# Patient Record
Sex: Female | Born: 2000
Health system: Southern US, Community
[De-identification: ages and names within clinical notes are randomized; demographics above are authoritative.]

## PROBLEM LIST (undated history)

## (undated) DIAGNOSIS — Z23 Encounter for immunization: Secondary | ICD-10-CM

## (undated) DIAGNOSIS — G47419 Narcolepsy without cataplexy: Secondary | ICD-10-CM

## (undated) DIAGNOSIS — B2790 Infectious mononucleosis, unspecified without complication: Secondary | ICD-10-CM

## (undated) HISTORY — PX: WISDOM TOOTH EXTRACTION: SHX21

## (undated) HISTORY — PX: NASAL SEPTUM SURGERY: SHX37

## (undated) HISTORY — DX: Infectious mononucleosis, unspecified without complication: B27.90

## (undated) HISTORY — DX: Narcolepsy without cataplexy: G47.419

## (undated) HISTORY — DX: Encounter for immunization: Z23

---
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
## 2020-01-08 NOTE — Progress Notes (Signed)
[place], [place]   Chief Complaint  Patient presents with  . Follow-up    medication f/u. nauseas with OCP's    HPI:      Marilyn Lee is a 19 y.o. [id number] whose LMP was Patient's last menstrual period was 12/12/2019., presents today for nausea with OCPs for the past 1-2 months. Started OCPs 2/21 and did fine initially. Takes at 9 PM without food, but has eaten dinner a few hrs earlier. Menses are monthly, lasting 3-5 days, no BTB, minimal dysmen. Doing ok with them otherwise.  She is sex active with occas bleeding due to tear/cut with shaving at post fourchette about 3-4 wks ago. Sex reopens area and bleeds..   Past Medical History:  Diagnosis Date  . Mononucleosis   . Vaccine for human papilloma virus (HPV) types 6, 11, 16, and 18 administered     Past Surgical History:  Procedure Laterality Date  . NASAL SEPTUM SURGERY    . WISDOM TOOTH EXTRACTION    . WISDOM TOOTH EXTRACTION      Family History  Problem Relation Age of Onset  . Hypertension Father   . Lung cancer Maternal Grandmother   . Hypertension Maternal Grandmother   . Hypertension Mother   . Asthma Sister   . Allergies Sister   . Heart disease Paternal Grandmother   . Pneumonia Paternal Grandmother   . Breast cancer Neg Hx   . Ovarian cancer Neg Hx     Social History   Socioeconomic History  . Marital status: Single    Spouse name: Not on file  . Number of children: Not on file  . Years of education: Not on file  . Highest education level: Not on file  Occupational History  . Not on file  Tobacco Use  . Smoking status: Never Smoker  . Smokeless tobacco: Never Used  Vaping Use  . Vaping Use: Never used  Substance and Sexual Activity  . Alcohol use: No  . Drug use: No  . Sexual activity: Yes    Birth control/protection: Condom  Other Topics Concern  . Not on file  Social History Narrative  . Not on file   Social Determinants of Health   Financial Resource Strain:   .  Difficulty of Paying Living Expenses: Not on file  Food Insecurity:   . Worried About Charity fundraiser in the Last Year: Not on file  . Ran Out of Food in the Last Year: Not on file  Transportation Needs:   . Lack of Transportation (Medical): Not on file  . Lack of Transportation (Non-Medical): Not on file  Physical Activity:   . Days of Exercise per Week: Not on file  . Minutes of Exercise per Session: Not on file  Stress:   . Feeling of Stress : Not on file  Social Connections:   . Frequency of Communication with Friends and Family: Not on file  . Frequency of Social Gatherings with Friends and Family: Not on file  . Attends Religious Services: Not on file  . Active Member of Clubs or Organizations: Not on file  . Attends Archivist Meetings: Not on file  . Marital Status: Not on file  Intimate Partner Violence:   . Fear of Current or Ex-Partner: Not on file  . Emotionally Abused: Not on file  . Physically Abused: Not on file  . Sexually Abused: Not on file    Outpatient Medications Prior to Visit  Medication  Sig Dispense Refill  . clindamycin (CLEOCIN) 300 MG capsule Take 300 mg by mouth 3 (three) times daily.    . Norethindrone Acetate-Ethinyl Estrad-FE (MICROGESTIN 24 FE) 1-20 MG-MCG(24) tablet Take 1 tablet by mouth daily. 84 tablet 3  . predniSONE (STERAPRED UNI-PAK 48 TAB) 10 MG (48) TBPK tablet Take by mouth as directed. (Patient not taking: Reported on 01/09/2020)     No facility-administered medications prior to visit.      ROS:  Review of Systems  Constitutional: Negative for fever.  Gastrointestinal: Positive for nausea. Negative for blood in stool, constipation, diarrhea and vomiting.  Genitourinary: Positive for vaginal bleeding. Negative for dyspareunia, dysuria, flank pain, frequency, hematuria, urgency, vaginal discharge and vaginal pain.  Musculoskeletal: Negative for back pain.  Skin: Negative for rash.   BREAST: No  symptoms   OBJECTIVE:   Vitals:  BP 118/74   Ht 5\' 5"  (1.651 m)   Wt 136 lb (61.7 kg)   LMP 12/12/2019   BMI 22.63 kg/m   Physical Exam Vitals reviewed.  Constitutional:      Appearance: She is well-developed.  Pulmonary:     Effort: Pulmonary effort is normal.  Genitourinary:    General: Normal vulva.    Musculoskeletal:        General: Normal range of motion.     Cervical back: Normal range of motion.  Skin:    General: Skin is warm and dry.  Neurological:     General: No focal deficit present.     Mental Status: She is alert and oriented to person, place, and time.     Cranial Nerves: No cranial nerve deficit.  Psychiatric:        Mood and Affect: Mood normal.        Behavior: Behavior normal.        Thought Content: Thought content normal.        Judgment: Judgment normal.     Assessment/Plan: Vaginal bleeding--post fourchette after cut with shaving. Sx recur with sex. Neg exam today. Abstinence for 2 wks to let fully heal, lubricants. F/u prn.   Nausea with OCP use for 1-2 months--discussed taking with food, closer to bedtime or trying 10 mcg pill vs POP. Pt will take with food first and see if sx improve. Since sx recent, could be due to hormone dose change in generic pills. F/u prn.     Return if symptoms worsen or fail to improve.  Willamina Grieshop B. Casyn Becvar, PA-C 01/09/2020 9:53 AM

## 2020-01-09 ENCOUNTER — Ambulatory Visit (INDEPENDENT_AMBULATORY_CARE_PROVIDER_SITE_OTHER): Payer: 59 | Admitting: Obstetrics and Gynecology

## 2020-01-09 ENCOUNTER — Encounter: Payer: Self-pay | Admitting: Obstetrics and Gynecology

## 2020-01-09 ENCOUNTER — Other Ambulatory Visit: Payer: Self-pay

## 2020-01-09 VITALS — BP 118/74 | Ht 65.0 in | Wt 136.0 lb

## 2020-01-09 DIAGNOSIS — N939 Abnormal uterine and vaginal bleeding, unspecified: Secondary | ICD-10-CM

## 2020-01-09 DIAGNOSIS — R110 Nausea: Secondary | ICD-10-CM | POA: Diagnosis not present

## 2020-01-09 NOTE — Patient Instructions (Signed)
I value your feedback and entrusting us with your care. If you get a [facility] patient survey, I would appreciate you taking the time to let us know about your experience today. Thank you!  As of March 31, 2019, your lab results will be released to your MyChart immediately, before I even have a chance to see them. Please give me time to review them and contact you if there are any abnormalities. Thank you for your patience.  

## 2020-01-18 ENCOUNTER — Other Ambulatory Visit: Payer: Self-pay

## 2020-01-18 ENCOUNTER — Telehealth: Payer: Self-pay

## 2020-01-18 DIAGNOSIS — Z30011 Encounter for initial prescription of contraceptive pills: Secondary | ICD-10-CM

## 2020-01-18 MED ORDER — MICROGESTIN 24 FE 1-20 MG-MCG PO TABS: 1.0000 | ORAL_TABLET | Freq: Every day | ORAL | 3 refills | Status: DC

## 2020-01-18 NOTE — Telephone Encounter (Signed)
Pt needing refill on OCP. Was seen 9/20, refilled

## 2020-02-24 ENCOUNTER — Ambulatory Visit (INDEPENDENT_AMBULATORY_CARE_PROVIDER_SITE_OTHER): Payer: 59

## 2020-02-24 ENCOUNTER — Other Ambulatory Visit: Payer: Self-pay

## 2020-02-24 DIAGNOSIS — Z23 Encounter for immunization: Secondary | ICD-10-CM

## 2020-04-23 ENCOUNTER — Ambulatory Visit: Payer: Self-pay

## 2020-05-01 ENCOUNTER — Other Ambulatory Visit: Payer: Self-pay

## 2020-05-01 ENCOUNTER — Ambulatory Visit
Admission: EM | Admit: 2020-05-01 | Discharge: 2020-05-01 | Disposition: A | Discharge status: Home / Self Care | Payer: 59 | Attending: Emergency Medicine | Admitting: Emergency Medicine

## 2020-05-01 ENCOUNTER — Encounter: Payer: Self-pay | Admitting: Emergency Medicine

## 2020-05-01 DIAGNOSIS — R1084 Generalized abdominal pain: Secondary | ICD-10-CM | POA: Diagnosis present

## 2020-05-01 DIAGNOSIS — N390 Urinary tract infection, site not specified: Secondary | ICD-10-CM | POA: Insufficient documentation

## 2020-05-01 DIAGNOSIS — R112 Nausea with vomiting, unspecified: Secondary | ICD-10-CM | POA: Insufficient documentation

## 2020-05-01 LAB — POCT URINALYSIS DIP (MANUAL ENTRY)
Glucose, UA: NEGATIVE mg/dL
Nitrite, UA: NEGATIVE
Spec Grav, UA: 1.025 (ref 1.010–1.025)
Urobilinogen, UA: 0.2 E.U./dL
pH, UA: 6 (ref 5.0–8.0)

## 2020-05-01 LAB — POCT URINE PREGNANCY: Preg Test, Ur: NEGATIVE

## 2020-05-01 MED ORDER — ALUM & MAG HYDROXIDE-SIMETH 200-200-20 MG/5ML PO SUSP
30.0000 mL | Freq: Once | ORAL | Status: AC
Start: 2020-05-01 — End: 2020-05-01
  Administered 2020-05-01: 30 mL via ORAL

## 2020-05-01 MED ORDER — LIDOCAINE VISCOUS HCL 2 % MT SOLN
15.0000 mL | Freq: Once | OROMUCOSAL | Status: AC
  Administered 2020-05-01: 15 mL via ORAL

## 2020-05-01 MED ORDER — ONDANSETRON 4 MG PO TBDP: 4.0000 mg | ORAL_TABLET | Freq: Three times a day (TID) | ORAL | 0 refills | Status: DC | PRN

## 2020-05-01 MED ORDER — NITROFURANTOIN MONOHYD MACRO 100 MG PO CAPS: 100.0000 mg | ORAL_CAPSULE | Freq: Two times a day (BID) | ORAL | 0 refills | Status: DC

## 2020-05-01 NOTE — Discharge Instructions (Addendum)
°  Urine culture was sent and someone will call if your result is abnormal  Get rest and drink fluids Zofran prescribed.  Take as directed.    DIET Instructions:  30 minutes after taking nausea medicine, begin with sips of clear liquids. If able to hold down 2 - 4 ounces for 30 minutes, begin drinking more. Increase your fluid intake to replace losses. Clear liquids only for 24 hours (water, tea, sport drinks, clear flat ginger ale or cola and juices, broth, jello, popsicles, ect). Advance to bland foods, applesauce, rice, baked or boiled chicken, ect. Avoid milk, greasy foods and anything that doesnt agree with you.  If you experience new or worsening symptoms return or go to ER such as fever, chills, nausea, vomiting, diarrhea, bloody or dark tarry stools, constipation, urinary symptoms, worsening abdominal discomfort, symptoms that do not improve with medications, inability to keep fluids down, etc..Marland Kitchen

## 2020-05-01 NOTE — ED Provider Notes (Addendum)
[facility]   [id number] 05/01/20 Arrival Time: 6948  CC: ABDOMINAL DISCOMFORT  SUBJECTIVE:  Marilyn Lee is a 20 y.o. female who presented to the urgent care with a complaint of abdominal pain, nausea, vomiting that started yesterday.  Denies a precipitating event, trauma, close contacts with similar symptoms, recent travel or antibiotic use.  Localizes pain to generalized abdomen.  Describes as constant and stabbing in character.  Has not tried any OTC medication.  Denies alleviating or aggravating factors.  Denies similar symptoms in the past.  Last BM 04/30/2020.  Report 1 emesis this morning.  Denies being pregnant.  Denies chills, fever, diarrhea, back pain, chest pain, shortness of breath.    Patient's last menstrual period was 04/02/2020.  ROS: As per HPI.  All other pertinent ROS negative.     Past Medical History:  Diagnosis Date  . Mononucleosis   . Vaccine for human papilloma virus (HPV) types 6, 11, 16, and 18 administered    Past Surgical History:  Procedure Laterality Date  . NASAL SEPTUM SURGERY    . WISDOM TOOTH EXTRACTION    . WISDOM TOOTH EXTRACTION     No Known Allergies No current facility-administered medications on file prior to encounter.   Current Outpatient Medications on File Prior to Encounter  Medication Sig Dispense Refill  . clindamycin (CLEOCIN) 300 MG capsule Take 300 mg by mouth 3 (three) times daily.    . Norethindrone Acetate-Ethinyl Estrad-FE (MICROGESTIN 24 FE) 1-20 MG-MCG(24) tablet Take 1 tablet by mouth daily. 84 tablet 3  . predniSONE (STERAPRED UNI-PAK 48 TAB) 10 MG (48) TBPK tablet Take by mouth as directed. (Patient not taking: Reported on 01/09/2020)     Social History   Socioeconomic History  . Marital status: Single    Spouse name: Not on file  . Number of children: Not on file  . Years of education: Not on file  . Highest education level: Not on file  Occupational History  . Not on file  Tobacco Use  . Smoking  status: Never Smoker  . Smokeless tobacco: Never Used  Vaping Use  . Vaping Use: Never used  Substance and Sexual Activity  . Alcohol use: No  . Drug use: No  . Sexual activity: Yes    Birth control/protection: Condom  Other Topics Concern  . Not on file  Social History Narrative  . Not on file   Social Determinants of Health   Financial Resource Strain: Not on file  Food Insecurity: Not on file  Transportation Needs: Not on file  Physical Activity: Not on file  Stress: Not on file  Social Connections: Not on file  Intimate Partner Violence: Not on file   Family History  Problem Relation Age of Onset  . Hypertension Father   . Lung cancer Maternal Grandmother   . Hypertension Maternal Grandmother   . Hypertension Mother   . Asthma Sister   . Allergies Sister   . Heart disease Paternal Grandmother   . Pneumonia Paternal Grandmother   . Breast cancer Neg Hx   . Ovarian cancer Neg Hx      OBJECTIVE:  Vitals:   05/01/20 1659 05/01/20 1700  BP:  113/71  Pulse:  96  Resp:  18  Temp:  98.9 F (37.2 C)  TempSrc:  Oral  SpO2:  98%  Weight: 129 lb (58.5 kg)   Height: 5\' 5"  (1.651 m)     Physical Exam Vitals and nursing note reviewed.  Constitutional:  General: She is not in acute distress.    Appearance: Normal appearance. She is normal weight. She is not ill-appearing, toxic-appearing or diaphoretic.  HENT:     Head: Normocephalic.  Cardiovascular:     Rate and Rhythm: Normal rate and regular rhythm.     Pulses: Normal pulses.     Heart sounds: Normal heart sounds. No murmur heard. No friction rub. No gallop.   Pulmonary:     Effort: Pulmonary effort is normal. No respiratory distress.     Breath sounds: Normal breath sounds. No stridor. No wheezing, rhonchi or rales.  Chest:     Chest wall: No tenderness.  Abdominal:     General: Abdomen is flat. Bowel sounds are normal. There is no distension.     Palpations: Abdomen is soft. There is no mass.      Tenderness: There is generalized abdominal tenderness. There is no right CVA tenderness, left CVA tenderness, guarding or rebound. Negative signs include McBurney's sign.     Hernia: No hernia is present.  Neurological:     Mental Status: She is alert and oriented to person, place, and time.      LABS: Results for orders placed or performed during the hospital encounter of 05/01/20 (from the past 24 hour(s))  POCT urine pregnancy     Status: None   Collection Time: 05/01/20  6:03 PM  Result Value Ref Range   Preg Test, Ur Negative Negative  POCT urinalysis dipstick     Status: Abnormal   Collection Time: 05/01/20  6:07 PM  Result Value Ref Range   Color, UA yellow yellow   Clarity, UA clear clear   Glucose, UA negative negative mg/dL   Bilirubin, UA small (A) negative   Ketones, POC UA >= (160) (A) negative mg/dL   Spec Grav, UA 1.025 1.010 - 1.025   Blood, UA moderate (A) negative   pH, UA 6.0 5.0 - 8.0   Protein Ur, POC trace (A) negative mg/dL   Urobilinogen, UA 0.2 0.2 or 1.0 E.U./dL   Nitrite, UA Negative Negative   Leukocytes, UA Small (1+) (A) Negative    DIAGNOSTIC STUDIES: No results found.   ASSESSMENT & PLAN:  1. Non-intractable vomiting with nausea, unspecified vomiting type   2. Abdominal pain, generalized   3. Acute lower UTI     Meds ordered this encounter  Medications  . AND Linked Order Group   . alum & mag hydroxide-simeth (MAALOX/MYLANTA) 200-200-20 MG/5ML suspension 30 mL   . lidocaine (XYLOCAINE) 2 % viscous mouth solution 15 mL  . ondansetron (ZOFRAN ODT) 4 MG disintegrating tablet    Sig: Take 1 tablet (4 mg total) by mouth every 8 (eight) hours as needed for nausea or vomiting.    Dispense:  30 tablet    Refill:  0  . nitrofurantoin, macrocrystal-monohydrate, (MACROBID) 100 MG capsule    Sig: Take 1 capsule (100 mg total) by mouth 2 (two) times daily.    Dispense:  10 capsule    Refill:  0   Patient is stable for discharge.  He was  advised to go to the ER for further evaluation.  She declined.  Discharge instructions  Urine culture was sent and someone will call if your result is abnormal  Get rest and drink fluids Zofran prescribed.  Take as directed.    DIET Instructions:  30 minutes after taking nausea medicine, begin with sips of clear liquids. If able to hold down 2 - 4 ounces for  30 minutes, begin drinking more. Increase your fluid intake to replace losses. Clear liquids only for 24 hours (water, tea, sport drinks, clear flat ginger ale or cola and juices, broth, jello, popsicles, ect). Advance to bland foods, applesauce, rice, baked or boiled chicken, ect. Avoid milk, greasy foods and anything that doesn't agree with you.  If you experience new or worsening symptoms return or go to ER such as fever, chills, nausea, vomiting, diarrhea, bloody or dark tarry stools, constipation, urinary symptoms, worsening abdominal discomfort, symptoms that do not improve with medications, inability to keep fluids down, etc...  Reviewed expectations re: course of current medical issues. Questions answered. Outlined signs and symptoms indicating need for more acute intervention. Patient verbalized understanding. After Visit Summary given.       Emerson Monte, [place] 05/01/20 1818

## 2020-05-01 NOTE — ED Triage Notes (Signed)
Upper abd pain/ cramping  that started yesterday reports vomiting x 1.  Last BM last night.

## 2020-05-02 ENCOUNTER — Ambulatory Visit: Payer: Self-pay

## 2020-05-03 ENCOUNTER — Encounter: Payer: Self-pay | Admitting: Family Medicine

## 2020-05-03 ENCOUNTER — Ambulatory Visit (INDEPENDENT_AMBULATORY_CARE_PROVIDER_SITE_OTHER): Payer: 59 | Admitting: Family Medicine

## 2020-05-03 ENCOUNTER — Other Ambulatory Visit: Payer: Self-pay

## 2020-05-03 ENCOUNTER — Inpatient Hospital Stay: Payer: 59 | Admitting: Family Medicine

## 2020-05-03 VITALS — BP 117/71 | HR 81 | Temp 98.2°F | Wt 130.6 lb

## 2020-05-03 DIAGNOSIS — R109 Unspecified abdominal pain: Secondary | ICD-10-CM | POA: Insufficient documentation

## 2020-05-03 DIAGNOSIS — R1013 Epigastric pain: Secondary | ICD-10-CM

## 2020-05-03 DIAGNOSIS — R197 Diarrhea, unspecified: Secondary | ICD-10-CM

## 2020-05-03 DIAGNOSIS — R110 Nausea: Secondary | ICD-10-CM | POA: Diagnosis not present

## 2020-05-03 LAB — URINE CULTURE
Culture: NO GROWTH
Special Requests: NORMAL

## 2020-05-03 MED ORDER — OMEPRAZOLE 20 MG PO CPDR: 20.0000 mg | DELAYED_RELEASE_CAPSULE | Freq: Every day | ORAL | 0 refills | Status: DC

## 2020-05-03 NOTE — Progress Notes (Signed)
    SUBJECTIVE:   CHIEF COMPLAINT / HPI:   Patient Active Problem List   Diagnosis Date Noted  . Abdominal pain 05/03/2020  . Mononucleosis     ABDOMINAL ISSUES - seen previously 1/11 at [facility] for N/V, abd pain. Given zofran, GI cocktail, macrobid for dirty UA, clean urine culture. - hasn't taken the antibiotic. - LMP 04/02/20 - took laxative, with subsequent BM next morning, felt better.  - nausea, lightheadedness, 1 episode vomiting, generalized > epigastric abd pain.  - fatigue ongoing for month  Duration: weeks Nature: sharp and stabbing Location: epigastric  Severity: mod  Radiation: no Frequency: intermittent, worse at work or with movement. Eating makes better Alleviating factors: eating Aggravating factors: movement Treatments attempted: laxatives Constipation: no Diarrhea: yes Episodes of diarrhea/day: 3 per day since laxative, previously 1 per day. Type 7 on BSC, previously type 1-2. Mucous in the stool: no Heartburn: yes Bloating:yes Nausea: yes, eating makes better. Vomiting: 1 episode Episodes of vomit/day: Melena or hematochezia: no Rash: no Jaundice: no Fever: no Weight loss: no   OBJECTIVE:   BP 117/71   Pulse 81   Temp 98.2 F (36.8 C)   Wt 130 lb 9.6 oz (59.2 kg)   SpO2 99%   BMI 21.73 kg/m   Gen: well appearing, in NAD Card: RRR Lungs: CTAB Abd: soft, +BS, only slightly TTP in epigastric area. Negative murphy sign. No tenderness of McBurney's point. No rebound tenderness or guarding. No organomegaly.  ASSESSMENT/PLAN:   Abdominal pain Likely 2/2 acid reflux given epigastric pain radiating into chest, better with eating. No organomegaly. Will obtain labs to r/o infection, liver/gallbladder etiology.  Will trial PPI. List of foods provided for stool bulking. F/u in 4 weeks.    Myles Gip, DO

## 2020-05-03 NOTE — Patient Instructions (Signed)
It was great to see you!  Our plans for today:  - Take the omeprazole to help with acid reflux. You can also take Tums as needed.  - We are checking some labs today, we will release these results to your MyChart. - Try the following foods to help bulk up your stool: black beans, avocado, sweet potatoes, pears/apples, carrots. - come back in 4 weeks for follow up.  Take care and seek immediate care sooner if you develop any concerns.   Dr. Ky Barban

## 2020-05-03 NOTE — Assessment & Plan Note (Signed)
Likely 2/2 acid reflux given epigastric pain radiating into chest, better with eating. No organomegaly. Will obtain labs to r/o infection, liver/gallbladder etiology.  Will trial PPI. List of foods provided for stool bulking. F/u in 4 weeks.

## 2020-05-04 LAB — CBC WITH DIFFERENTIAL
Basophils Absolute: 0 10*3/uL (ref 0.0–0.2)
Basos: 1 %
EOS (ABSOLUTE): 0 10*3/uL (ref 0.0–0.4)
Eos: 1 %
Hematocrit: 40.8 % (ref 34.0–46.6)
Hemoglobin: 13.7 g/dL (ref 11.1–15.9)
Immature Grans (Abs): 0 10*3/uL (ref 0.0–0.1)
Immature Granulocytes: 0 %
Lymphocytes Absolute: 1.4 10*3/uL (ref 0.7–3.1)
Lymphs: 40 %
MCH: 29 pg (ref 26.6–33.0)
MCHC: 33.6 g/dL (ref 31.5–35.7)
MCV: 86 fL (ref 79–97)
Monocytes Absolute: 0.2 10*3/uL (ref 0.1–0.9)
Monocytes: 7 %
Neutrophils Absolute: 1.8 10*3/uL (ref 1.4–7.0)
Neutrophils: 51 %
RBC: 4.73 x10E6/uL (ref 3.77–5.28)
RDW: 12.4 % (ref 11.7–15.4)
WBC: 3.5 10*3/uL (ref 3.4–10.8)

## 2020-05-04 LAB — COMPREHENSIVE METABOLIC PANEL
ALT: 23 IU/L (ref 0–32)
AST: 25 IU/L (ref 0–40)
Albumin/Globulin Ratio: 1.8 (ref 1.2–2.2)
Albumin: 4.2 g/dL (ref 3.9–5.0)
Alkaline Phosphatase: 50 IU/L (ref 42–106)
BUN/Creatinine Ratio: 13 (ref 9–23)
BUN: 11 mg/dL (ref 6–20)
Bilirubin Total: 0.3 mg/dL (ref 0.0–1.2)
CO2: 24 mmol/L (ref 20–29)
Calcium: 8.6 mg/dL — ABNORMAL LOW (ref 8.7–10.2)
Chloride: 103 mmol/L (ref 96–106)
Creatinine, Ser: 0.85 mg/dL (ref 0.57–1.00)
GFR calc Af Amer: 115 mL/min/{1.73_m2} (ref >59)
GFR calc non Af Amer: 100 mL/min/{1.73_m2} (ref >59)
Globulin, Total: 2.3 g/dL (ref 1.5–4.5)
Glucose: 84 mg/dL (ref 65–99)
Potassium: 4.4 mmol/L (ref 3.5–5.2)
Sodium: 140 mmol/L (ref 134–144)
Total Protein: 6.5 g/dL (ref 6.0–8.5)

## 2020-05-04 LAB — TSH: TSH: 1.73 u[IU]/mL (ref 0.450–4.500)

## 2020-05-31 ENCOUNTER — Encounter: Payer: Self-pay | Admitting: Family Medicine

## 2020-05-31 ENCOUNTER — Ambulatory Visit (INDEPENDENT_AMBULATORY_CARE_PROVIDER_SITE_OTHER): Payer: 59 | Admitting: Family Medicine

## 2020-05-31 ENCOUNTER — Other Ambulatory Visit: Payer: Self-pay

## 2020-05-31 VITALS — BP 113/74 | HR 73 | Temp 98.4°F | Wt 131.6 lb

## 2020-05-31 DIAGNOSIS — R1013 Epigastric pain: Secondary | ICD-10-CM

## 2020-05-31 NOTE — Progress Notes (Signed)
BP 113/74   Pulse 73   Temp 98.4 F (36.9 C) (Oral)   Wt 131 lb 9.6 oz (59.7 kg)   SpO2 98%   BMI 21.90 kg/m    Subjective:    Patient ID: Marilyn Lee, female    DOB: 10/22/2000, 20 y.o.   MRN: [id number]  HPI: [name] is a 20 y.o. female  Chief Complaint  Patient presents with  . Abdominal Pain  . Follow-up   Was taking omeprazole for about 3-4 days. Her abdominal pain completely resolved. She noticed some white streaks in her stool, so she stopped taking it. This has since resolved. She has not had any abdominal pain. No other concerns or complaints at this time.   Relevant past medical, surgical, family and social history reviewed and updated as indicated. Interim medical history since our last visit reviewed. Allergies and medications reviewed and updated.  Review of Systems  Constitutional: Negative.   Respiratory: Negative.   Cardiovascular: Negative.   Gastrointestinal: Negative.   Psychiatric/Behavioral: Negative.     Per HPI unless specifically indicated above     Objective:    BP 113/74   Pulse 73   Temp 98.4 F (36.9 C) (Oral)   Wt 131 lb 9.6 oz (59.7 kg)   SpO2 98%   BMI 21.90 kg/m   Wt Readings from Last 3 Encounters:  05/31/20 131 lb 9.6 oz (59.7 kg) (57 %, Z= 0.18)*  05/03/20 130 lb 9.6 oz (59.2 kg) (56 %, Z= 0.14)*  05/01/20 129 lb (58.5 kg) (53 %, Z= 0.07)*   * Growth percentiles are based on CDC (Girls, 2-20 Years) data.    Physical Exam Vitals and nursing note reviewed.  Constitutional:      General: She is not in acute distress.    Appearance: Normal appearance. She is not ill-appearing, toxic-appearing or diaphoretic.  HENT:     Head: Normocephalic and atraumatic.     Right Ear: External ear normal.     Left Ear: External ear normal.     Nose: Nose normal.     Mouth/Throat:     Mouth: Mucous membranes are moist.     Pharynx: Oropharynx is clear.  Eyes:     General: No scleral icterus.       Right eye: No discharge.         Left eye: No discharge.     Extraocular Movements: Extraocular movements intact.     Conjunctiva/sclera: Conjunctivae normal.     Pupils: Pupils are equal, round, and reactive to light.  Cardiovascular:     Rate and Rhythm: Normal rate and regular rhythm.     Pulses: Normal pulses.     Heart sounds: Normal heart sounds. No murmur heard. No friction rub. No gallop.   Pulmonary:     Effort: Pulmonary effort is normal. No respiratory distress.     Breath sounds: Normal breath sounds. No stridor. No wheezing, rhonchi or rales.  Chest:     Chest wall: No tenderness.  Musculoskeletal:        General: Normal range of motion.     Cervical back: Normal range of motion and neck supple.  Skin:    General: Skin is warm and dry.     Capillary Refill: Capillary refill takes less than 2 seconds.     Coloration: Skin is not jaundiced or pale.     Findings: No bruising, erythema, lesion or rash.  Neurological:     General: No focal  deficit present.     Mental Status: She is alert and oriented to person, place, and time. Mental status is at baseline.  Psychiatric:        Mood and Affect: Mood normal.        Behavior: Behavior normal.        Thought Content: Thought content normal.        Judgment: Judgment normal.     Results for orders placed or performed in visit on 05/03/20  Comprehensive metabolic panel  Result Value Ref Range   Glucose 84 65 - 99 mg/dL   BUN 11 6 - 20 mg/dL   Creatinine, Ser 0.85 0.57 - 1.00 mg/dL   GFR calc non Af Amer 100 >59 mL/min/1.73   GFR calc Af Amer 115 >59 mL/min/1.73   BUN/Creatinine Ratio 13 9 - 23   Sodium 140 134 - 144 mmol/L   Potassium 4.4 3.5 - 5.2 mmol/L   Chloride 103 96 - 106 mmol/L   CO2 24 20 - 29 mmol/L   Calcium 8.6 (L) 8.7 - 10.2 mg/dL   Total Protein 6.5 6.0 - 8.5 g/dL   Albumin 4.2 3.9 - 5.0 g/dL   Globulin, Total 2.3 1.5 - 4.5 g/dL   Albumin/Globulin Ratio 1.8 1.2 - 2.2   Bilirubin Total 0.3 0.0 - 1.2 mg/dL   Alkaline  Phosphatase 50 42 - 106 IU/L   AST 25 0 - 40 IU/L   ALT 23 0 - 32 IU/L  TSH  Result Value Ref Range   TSH 1.730 0.450 - 4.500 uIU/mL  CBC With Differential  Result Value Ref Range   WBC 3.5 3.4 - 10.8 x10E3/uL   RBC 4.73 3.77 - 5.28 x10E6/uL   Hemoglobin 13.7 11.1 - 15.9 g/dL   Hematocrit 40.8 34.0 - 46.6 %   MCV 86 79 - 97 fL   MCH 29.0 26.6 - 33.0 pg   MCHC 33.6 31.5 - 35.7 g/dL   RDW 12.4 11.7 - 15.4 %   Neutrophils 51 Not Estab. %   Lymphs 40 Not Estab. %   Monocytes 7 Not Estab. %   Eos 1 Not Estab. %   Basos 1 Not Estab. %   Neutrophils Absolute 1.8 1.4 - 7.0 x10E3/uL   Lymphocytes Absolute 1.4 0.7 - 3.1 x10E3/uL   Monocytes Absolute 0.2 0.1 - 0.9 x10E3/uL   EOS (ABSOLUTE) 0.0 0.0 - 0.4 x10E3/uL   Basophils Absolute 0.0 0.0 - 0.2 x10E3/uL   Immature Granulocytes 0 Not Estab. %   Immature Grans (Abs) 0.0 0.0 - 0.1 x10E3/uL      Assessment & Plan:   Problem List Items Addressed This Visit      Other   Abdominal pain - Primary    Resolved. No longer on any medicine. Continue to monitor. Call with any concerns.          Follow up plan: Return June, physical.

## 2020-05-31 NOTE — Assessment & Plan Note (Signed)
Resolved. No longer on any medicine. Continue to monitor. Call with any concerns.

## 2020-07-18 ENCOUNTER — Ambulatory Visit: Payer: Self-pay | Admitting: *Deleted

## 2020-07-18 NOTE — Telephone Encounter (Signed)
Pt reports increased fatigue, decreased energy, onset 4 months ago. "I have to nap during the day." Also reports headache, onset 2 weeks ago. States intermittent, last 2 days "Mostly above right eye." Rates at 5/10 "8/10 when at work." Has taken IBU, "Helps a little." No visual changes, does reports occasional dizziness with headaches "Not often." States has BP checked when she gives plasma "Always normal." Reports mild abdominal pain at times, dull, right sided, does not occur often or daily. Agent secured VV for tomorrow prior to triage. Advised ED if symptoms worsen. Pt verbalizes understanding.   Reason for Disposition . [1] MODERATE headache (e.g., interferes with normal activities) AND [2] present > 24 hours AND [3] unexplained  (Exceptions: analgesics not tried, typical migraine, or headache part of viral illness)  Answer Assessment - Initial Assessment Questions 1. LOCATION: "Where does it hurt?"      Right side around eye, little above 2. ONSET: "When did the headache start?" (Minutes, hours or days)      2 weeks ago 3. PATTERN: "Does the pain come and go, or has it been constant since it started?"     Comes and goes, occurs daily 4. SEVERITY: "How bad is the pain?" and "What does it keep you from doing?"  (e.g., Scale 1-10; mild, moderate, or severe)   - MILD (1-3): doesn't interfere with normal activities    - MODERATE (4-7): interferes with normal activities or awakens from sleep    - SEVERE (8-10): excruciating pain, unable to do any normal activities        5-8/10 5. RECURRENT SYMPTOM: "Have you ever had headaches before?" If Yes, ask: "When was the last time?" and "What happened that time?"      no 6. CAUSE: "What do you think is causing the headache?"     unsure 7. MIGRAINE: "Have you been diagnosed with migraine headaches?" If Yes, ask: "Is this headache similar?"      no 8. HEAD INJURY: "Has there been any recent injury to the head?"      no 9. OTHER SYMPTOMS: "Do you have  any other symptoms?" (fever, stiff neck, eye pain, sore throat, cold symptoms)     Occasional dizziness with headaches, fatigued last 4 months.  10. PREGNANCY: "Is there any chance you are pregnant?" "When was your last menstrual period?" no  Protocols used: HEADACHE-A-AH

## 2020-07-19 ENCOUNTER — Telehealth (INDEPENDENT_AMBULATORY_CARE_PROVIDER_SITE_OTHER): Payer: 59 | Admitting: Family Medicine

## 2020-07-19 ENCOUNTER — Encounter: Payer: Self-pay | Admitting: Family Medicine

## 2020-07-19 ENCOUNTER — Other Ambulatory Visit: Payer: Self-pay

## 2020-07-19 DIAGNOSIS — G4489 Other headache syndrome: Secondary | ICD-10-CM | POA: Diagnosis not present

## 2020-07-19 DIAGNOSIS — Z13220 Encounter for screening for lipoid disorders: Secondary | ICD-10-CM

## 2020-07-19 DIAGNOSIS — Z8619 Personal history of other infectious and parasitic diseases: Secondary | ICD-10-CM

## 2020-07-19 DIAGNOSIS — R109 Unspecified abdominal pain: Secondary | ICD-10-CM | POA: Diagnosis not present

## 2020-07-19 DIAGNOSIS — R10A0 Flank pain, unspecified side: Secondary | ICD-10-CM

## 2020-07-19 DIAGNOSIS — R5382 Chronic fatigue, unspecified: Secondary | ICD-10-CM

## 2020-07-19 NOTE — Progress Notes (Signed)
There were no vitals taken for this visit.   Subjective:    Patient ID: Marilyn Lee, female    DOB: [birth year], 20 y.o.   MRN: [id number]  HPI: Marilyn Lee is a 20 y.o. female  Chief Complaint  Patient presents with  . Headache    Patient states she has been having daily headaches for about 2-3 weeks.   . Flank Pain    Patient sstates for aabout 2 weeks she is having pain in her left side that comes and goes.   . Fatigue    Patient states for about 5-6 months she is very fatigue daily.    Has been having headaches in her R temple for 3 weeks. Lasts 5 min to 1hour. Can happen multiple times a day, up to 10x a day. Happen at random. Nothing seems to bring it on. Nothing seems to make it worse. Pressure seems to make it a bit better. She has been taking ibuprofen which sometimes seems to help and sometimes doesn't. No fever or chills. She notes that she has been having hives on her face and occasionally on her arms. She has been having hives for over a month. They seem to come at random.   Has been having pain in her L side for 2 weeks. It's in her side. Her pain is in her side. Not around the time of her period. Pain is severe to the point that she needs to sit down. Periods have been normal.   FATIGUE Duration:  5-6 months Severity: moderate  Onset: gradual Context when symptoms started:  unknown Symptoms improve with rest: no  Depressive symptoms: no Stress/anxiety: no Insomnia: no  Snoring: no Observed apnea by bed partner: no Daytime hypersomnolence:yes Wakes feeling refreshed: no History of sleep study: no Dysnea on exertion:  no Orthopnea/PND: no Chest pain: no Chronic cough: no Lower extremity edema: no Arthralgias:no Myalgias: no Weakness: no Rash: no   Relevant past medical, surgical, family and social history reviewed and updated as indicated. Interim medical history since our last visit reviewed. Allergies and medications reviewed and updated.  Review  of Systems  Constitutional: Positive for fatigue. Negative for activity change, appetite change, chills, diaphoresis, fever and unexpected weight change.  Respiratory: Negative.   Cardiovascular: Negative.   Gastrointestinal: Negative.   Genitourinary: Positive for flank pain. Negative for decreased urine volume, difficulty urinating, dyspareunia, dysuria, enuresis, frequency, genital sores, hematuria, menstrual problem, pelvic pain, urgency, vaginal bleeding, vaginal discharge and vaginal pain.  Neurological: Positive for headaches. Negative for dizziness, tremors, seizures, syncope, facial asymmetry, speech difficulty, weakness, light-headedness and numbness.  Psychiatric/Behavioral: Negative.     Per HPI unless specifically indicated above     Objective:    There were no vitals taken for this visit.  Wt Readings from Last 3 Encounters:  05/31/20 131 lb 9.6 oz (59.7 kg) (57 %, Z= 0.18)*  05/03/20 130 lb 9.6 oz (59.2 kg) (56 %, Z= 0.14)*  05/01/20 129 lb (58.5 kg) (53 %, Z= 0.07)*   * Growth percentiles are based on CDC (Girls, 2-20 Years) data.    Physical Exam Vitals and nursing note reviewed.  Constitutional:      General: She is not in acute distress.    Appearance: Normal appearance. She is not ill-appearing, toxic-appearing or diaphoretic.  HENT:     Head: Normocephalic and atraumatic.     Right Ear: External ear normal.     Left Ear: External ear normal.  Nose: Nose normal.     Mouth/Throat:     Mouth: Mucous membranes are moist.     Pharynx: Oropharynx is clear.  Eyes:     General: No scleral icterus.       Right eye: No discharge.        Left eye: No discharge.     Conjunctiva/sclera: Conjunctivae normal.     Pupils: Pupils are equal, round, and reactive to light.  Pulmonary:     Effort: Pulmonary effort is normal. No respiratory distress.     Comments: Speaking in full sentences Musculoskeletal:        General: Normal range of motion.     Cervical back:  Normal range of motion.  Skin:    Coloration: Skin is not jaundiced or pale.     Findings: No bruising, erythema, lesion or rash.  Neurological:     Mental Status: She is alert and oriented to person, place, and time. Mental status is at baseline.  Psychiatric:        Mood and Affect: Mood normal.        Behavior: Behavior normal.        Thought Content: Thought content normal.        Judgment: Judgment normal.     Results for orders placed or performed in visit on 05/03/20  Comprehensive metabolic panel  Result Value Ref Range   Glucose 84 65 - 99 mg/dL   BUN 11 6 - 20 mg/dL   Creatinine, Ser 0.85 0.57 - 1.00 mg/dL   GFR calc non Af Amer 100 >59 mL/min/1.73   GFR calc Af Amer 115 >59 mL/min/1.73   BUN/Creatinine Ratio 13 9 - 23   Sodium 140 134 - 144 mmol/L   Potassium 4.4 3.5 - 5.2 mmol/L   Chloride 103 96 - 106 mmol/L   CO2 24 20 - 29 mmol/L   Calcium 8.6 (L) 8.7 - 10.2 mg/dL   Total Protein 6.5 6.0 - 8.5 g/dL   Albumin 4.2 3.9 - 5.0 g/dL   Globulin, Total 2.3 1.5 - 4.5 g/dL   Albumin/Globulin Ratio 1.8 1.2 - 2.2   Bilirubin Total 0.3 0.0 - 1.2 mg/dL   Alkaline Phosphatase 50 42 - 106 IU/L   AST 25 0 - 40 IU/L   ALT 23 0 - 32 IU/L  TSH  Result Value Ref Range   TSH 1.730 0.450 - 4.500 uIU/mL  CBC With Differential  Result Value Ref Range   WBC 3.5 3.4 - 10.8 x10E3/uL   RBC 4.73 3.77 - 5.28 x10E6/uL   Hemoglobin 13.7 11.1 - 15.9 g/dL   Hematocrit 40.8 34.0 - 46.6 %   MCV 86 79 - 97 fL   MCH 29.0 26.6 - 33.0 pg   MCHC 33.6 31.5 - 35.7 g/dL   RDW 12.4 11.7 - 15.4 %   Neutrophils 51 Not Estab. %   Lymphs 40 Not Estab. %   Monocytes 7 Not Estab. %   Eos 1 Not Estab. %   Basos 1 Not Estab. %   Neutrophils Absolute 1.8 1.4 - 7.0 x10E3/uL   Lymphocytes Absolute 1.4 0.7 - 3.1 x10E3/uL   Monocytes Absolute 0.2 0.1 - 0.9 x10E3/uL   EOS (ABSOLUTE) 0.0 0.0 - 0.4 x10E3/uL   Basophils Absolute 0.0 0.0 - 0.2 x10E3/uL   Immature Granulocytes 0 Not Estab. %   Immature  Grans (Abs) 0.0 0.0 - 0.1 x10E3/uL      Assessment & Plan:   Problem List Items Addressed  This Visit   None   Visit Diagnoses    Chronic fatigue    -  Primary   Labs to be drawn tomorrow. Await results. Treat as needed. Will get in for in-person exam tomorrow.    Relevant Orders   Lyme Ab/Western Blot Reflex   Rocky mtn spotted fvr abs pnl(IgG+IgM)   Comprehensive metabolic panel   CBC with Differential/Platelet   TSH   VITAMIN D 25 Hydroxy (Vit-D Deficiency, Fractures)   Babesia microti Antibody Panel   Ehrlichia Antibody Panel   Flank pain       Will get in for in person exam tomorrow and labs including UA. Await results.    Relevant Orders   Urinalysis, Routine w reflex microscopic   Screening for cholesterol level       Labs to be drawn tomorrow. Await results. Treat as needed.    Relevant Orders   Lipid Panel w/o Chol/HDL Ratio   Other headache syndrome       Will check labs to look for tick disease or other cause, ?migraines. If labs normal, consider starting nortriptyline.    History of tapeworm infection       Will send home with stool studies. Await results.    Relevant Orders   Ova and parasite examination   Fecal leukocytes   Stool C-Diff Toxin Assay   Stool Culture   Fecal occult blood, imunochemical       Follow up plan: Return tomorrow in person.   . This visit was completed via MyChart due to the restrictions of the COVID-19 pandemic. All issues as above were discussed and addressed. Physical exam was done as above through visual confirmation on MyChart. If it was felt that the patient should be evaluated in the office, they were directed there. The patient verbally consented to this visit. . Location of the patient: parking lot . Location of the provider: work . Those involved with this call:  . Provider: Park Liter, DO . CMA: Louanna Raw, [place] . Front Desk/Registration: Roe Rutherford  . Time spent on call: 25 minutes with patient face to face  via video conference. More than 50% of this time was spent in counseling and coordination of care. 40 minutes total spent in review of patient's record and preparation of their chart.

## 2020-07-20 ENCOUNTER — Encounter: Payer: Self-pay | Admitting: Family Medicine

## 2020-07-20 ENCOUNTER — Ambulatory Visit (INDEPENDENT_AMBULATORY_CARE_PROVIDER_SITE_OTHER): Payer: 59 | Admitting: Family Medicine

## 2020-07-20 ENCOUNTER — Other Ambulatory Visit: Payer: Self-pay

## 2020-07-20 VITALS — BP 120/76 | HR 84 | Temp 98.1°F | Wt 130.6 lb

## 2020-07-20 DIAGNOSIS — A071 Giardiasis [lambliasis]: Secondary | ICD-10-CM | POA: Diagnosis not present

## 2020-07-20 DIAGNOSIS — R5382 Chronic fatigue, unspecified: Secondary | ICD-10-CM | POA: Diagnosis not present

## 2020-07-20 DIAGNOSIS — R109 Unspecified abdominal pain: Secondary | ICD-10-CM

## 2020-07-20 DIAGNOSIS — Z13220 Encounter for screening for lipoid disorders: Secondary | ICD-10-CM

## 2020-07-20 DIAGNOSIS — Z8619 Personal history of other infectious and parasitic diseases: Secondary | ICD-10-CM

## 2020-07-20 LAB — URINALYSIS, ROUTINE W REFLEX MICROSCOPIC
Bilirubin, UA: NEGATIVE
Glucose, UA: NEGATIVE
Ketones, UA: NEGATIVE
Nitrite, UA: NEGATIVE
Protein,UA: NEGATIVE
RBC, UA: NEGATIVE
Specific Gravity, UA: 1.02 (ref 1.005–1.030)
Urobilinogen, Ur: 0.2 mg/dL (ref 0.2–1.0)
pH, UA: 7 (ref 5.0–7.5)

## 2020-07-20 LAB — MICROSCOPIC EXAMINATION
Bacteria, UA: NONE SEEN
Epithelial Cells (non renal): NONE SEEN /hpf (ref 0–10)
RBC, Urine: NONE SEEN /hpf (ref 0–2)
WBC, UA: NONE SEEN /hpf (ref 0–5)

## 2020-07-20 MED ORDER — METRONIDAZOLE 500 MG PO TABS: 500.0000 mg | ORAL_TABLET | Freq: Three times a day (TID) | ORAL | 0 refills | Status: DC

## 2020-07-20 NOTE — Patient Instructions (Addendum)
Giardiasis  Giardiasis is an infection of the small intestine with the parasite Giardia. The parasite is often found in unclean (contaminated) water or areas with poor sanitation. It usually takes 1-3 weeks after swallowing the parasite to become sick. The illness usually lasts 2-6 weeks, but it may last longer. What are the causes? This condition is caused by swallowing the parasite. This can happen if you:  Drink contaminated water or eat ice that was made with contaminated water.  Eat contaminated food. This can include raw food that was washed with contaminated water.  Come into contact with the stool (feces) of a person who is infected and then passing the parasite from your hands to your mouth. This can happen after coming in contact with a surface that contains feces, such as bathroom handles or diaper changing tables.  Swallow contaminated water when swimming in lakes, ponds, or rivers. What increases the risk? You are more likely to develop this condition if you:  Do not have access to clean water.  Are the parent of a young child.  Work at a daycare or long-term care facility.  Live with or care for someone with giardiasis.  Frequently camp or hike and drink unclean water.  Travel to areas that to do not have a clean water supply.  Have anal sex without a condom with someone who is infected. You may also be more likely to develop this condition if you have certain digestive or gastric disorders. What are the signs or symptoms? Symptoms of this condition include:  Bad-smelling and watery diarrhea.  Nausea.  Stomach cramps and pain.  Gas or bloating.  Greasy feces that float.  Fatigue.  Fever. Severe symptoms of this condition include:  Weight loss.  Dehydration. Signs of dehydration can include: ? Dry mouth and sticky tongue. ? Headaches. ? Dizziness. ? Irritability. ? Dry skin. ? Weakness. ? Dark yellow urine or a decrease in  urination. ? Constipation. Some people do not develop symptoms, but they can still infect other people. How is this diagnosed? This condition is diagnosed based on:  Your medical history.  Your symptoms.  A physical exam.  Your travel history.  Tests, including: ? A stool sample test. You may need to give several stool samples for a few days. ? Blood tests. How is this treated? This condition is treated with antibiotic medicines or medicines that kill the parasite (antiparasitics). If you are pregnant, you may need to take different medicines. For mild symptoms, you may not need medicine. It is important to stay hydrated. It is important to discuss treatment options with your health care provider. Follow these instructions at home: Medicines  Take over-the-counter and prescription medicines only as told by your health care provider.  If you were prescribed an antibiotic medicine, take it as told by your health care provider. Do not stop taking the antibiotic even if your condition improves. This is important. Eating and drinking  Drink enough fluid to keep your urine pale yellow. Watch for any symptoms of dehydration.  Follow your health care provider's instructions about what you can or cannot eat or drink, such as dairy products. General instructions  Always wash your hands after going to the bathroom or changing diapers, and before preparing food. Use soap and water when washing your hands.  Children with symptoms should not go to daycare or school until symptoms have gone away.  Do not swimin a public swimming pool, hot tub, or lake until at least a week  after symptoms have gone away.  Keep all follow-up visits as told by your health care provider. This is important.   How is this prevented?  Boil your water or drink bottled water in areas of giardiasis contamination.  Do not eat ice that was made from an unclean water supply. Do not drink beverages with ice that was  made from an unclean water supply.  Do not swallow water while swimming in pools, hot tubs, ponds, lakes, or the ocean.  Use safe, clean water to wash all foods.  Avoid eating raw or undercooked foods from areas with unclean water supplies.  Always wash your hands after going to the bathroom, changing diapers, or gardening, and before preparing food. Use soap and water when washing your hands.  Practice safe sex and use a condom if you have anal sex.  Clean and disinfect areas in the house that may have been contaminated with feces from an infected person. These include hard surfaces, furniture, bedding, and toys.   Contact a health care provider if:  Your symptoms get worse.  Your symptoms come back after you finish treatment.  You have symptoms of dehydration, including: ? Dry mouth and sticky tongue. ? Headaches. ? Dizziness. ? Irritability. ? Dry skin. ? Weakness. ? Dark yellow urine or a decrease in urination. ? Constipation.  You have a fever.  You keep losing weight after treatment.  You have joint pain. Get help right away if:  You have blood in your feces.  You have symptoms of severe dehydration, including: ? Rapid heartbeat. ? Sunken eyes. ? Inability to sweat or produce tears. ? No urination for more than 8 hours. ? Extreme thirst. ? Low blood pressure. ? Confusion. ? Fever. Summary  Giardiasis is an infection of the small intestine with the parasite Giardia.  This parasite is often found in unclean (contaminated) water or areas with poor sanitation.  You can develop this condition after swallowing the parasite.  This condition is treated with antibiotic medicines or medicines that kill the parasite (antiparasitics).  Handwashing is an important part of preventing giardiasis. Always wash your hands after going to the bathroom, changing diapers, or gardening, and before preparing food. Use soap and water when washing your hands. This information is  not intended to replace advice given to you by your health care provider. Make sure you discuss any questions you have with your health care provider. Document Revised: 05/18/2018 Document Reviewed: 02/10/2018 Elsevier Patient Education  2021 [employer].

## 2020-07-20 NOTE — Progress Notes (Signed)
BP 120/76   Pulse 84   Temp 98.1 F (36.7 C)   Wt 130 lb 9.6 oz (59.2 kg)   SpO2 98%   BMI 21.73 kg/m    Subjective:    Patient ID: Marilyn Lee, female    DOB: 01-21-2001, 20 y.o.   MRN: [id number]  HPI: Marilyn Lee is a 20 y.o. female  Chief Complaint  Patient presents with  . Flank Pain    Follow up   . chronic fatigue    Follow up   . Headache    Follow up    Patient evaluated yesterday by virtual visit. Needed in person evaluation with labs for physical exam. She notes today that she took a stool sample to her vertrinarian and that they told her she had tapeworms. See scanned document from him. She notes that her symptoms have not changed. She continues to be very tired, having headaches and pain- her pain is actually in the LUQ, not flank pain. No other concerns or complaints at this time.   Relevant past medical, surgical, family and social history reviewed and updated as indicated. Interim medical history since our last visit reviewed. Allergies and medications reviewed and updated.  Review of Systems  Constitutional: Positive for fatigue. Negative for activity change, appetite change, chills, diaphoresis, fever and unexpected weight change.  HENT: Negative.   Respiratory: Negative.   Cardiovascular: Negative.   Gastrointestinal: Negative.   Genitourinary: Positive for flank pain. Negative for decreased urine volume, difficulty urinating, dyspareunia, dysuria, enuresis, frequency, genital sores, hematuria, menstrual problem, pelvic pain, urgency, vaginal bleeding, vaginal discharge and vaginal pain.  Neurological: Positive for headaches. Negative for dizziness, tremors, seizures, syncope, facial asymmetry, speech difficulty, weakness, light-headedness and numbness.  Hematological: Negative.   Psychiatric/Behavioral: Negative.     Per HPI unless specifically indicated above     Objective:    BP 120/76   Pulse 84   Temp 98.1 F (36.7 C)   Wt 130 lb 9.6 oz  (59.2 kg)   SpO2 98%   BMI 21.73 kg/m   Wt Readings from Last 3 Encounters:  07/20/20 130 lb 9.6 oz (59.2 kg) (55 %, Z= 0.12)*  05/31/20 131 lb 9.6 oz (59.7 kg) (57 %, Z= 0.18)*  05/03/20 130 lb 9.6 oz (59.2 kg) (56 %, Z= 0.14)*   * Growth percentiles are based on CDC (Girls, 2-20 Years) data.    Physical Exam Vitals and nursing note reviewed.  Constitutional:      General: She is not in acute distress.    Appearance: Normal appearance. She is not ill-appearing, toxic-appearing or diaphoretic.  HENT:     Head: Normocephalic and atraumatic.     Right Ear: External ear normal.     Left Ear: External ear normal.     Nose: Nose normal.     Mouth/Throat:     Mouth: Mucous membranes are moist.     Pharynx: Oropharynx is clear.  Eyes:     General: No scleral icterus.       Right eye: No discharge.        Left eye: No discharge.     Extraocular Movements: Extraocular movements intact.     Conjunctiva/sclera: Conjunctivae normal.     Pupils: Pupils are equal, round, and reactive to light.  Cardiovascular:     Rate and Rhythm: Normal rate and regular rhythm.     Pulses: Normal pulses.     Heart sounds: Normal heart sounds. No murmur heard. No  friction rub. No gallop.   Pulmonary:     Effort: Pulmonary effort is normal. No respiratory distress.     Breath sounds: Normal breath sounds. No stridor. No wheezing, rhonchi or rales.  Chest:     Chest wall: No tenderness.  Abdominal:     General: Bowel sounds are normal. There is no distension.     Palpations: Abdomen is soft. There is no mass.     Tenderness: There is no abdominal tenderness. There is no guarding.  Musculoskeletal:        General: Normal range of motion.     Cervical back: Normal range of motion and neck supple.  Skin:    General: Skin is warm and dry.     Capillary Refill: Capillary refill takes less than 2 seconds.     Coloration: Skin is not jaundiced or pale.     Findings: No bruising, erythema, lesion or rash.   Neurological:     General: No focal deficit present.     Mental Status: She is alert and oriented to person, place, and time. Mental status is at baseline.  Psychiatric:        Mood and Affect: Mood normal.        Behavior: Behavior normal.        Thought Content: Thought content normal.        Judgment: Judgment normal.     Results for orders placed or performed in visit on 05/03/20  Comprehensive metabolic panel  Result Value Ref Range   Glucose 84 65 - 99 mg/dL   BUN 11 6 - 20 mg/dL   Creatinine, Ser 0.85 0.57 - 1.00 mg/dL   GFR calc non Af Amer 100 >59 mL/min/1.73   GFR calc Af Amer 115 >59 mL/min/1.73   BUN/Creatinine Ratio 13 9 - 23   Sodium 140 134 - 144 mmol/L   Potassium 4.4 3.5 - 5.2 mmol/L   Chloride 103 96 - 106 mmol/L   CO2 24 20 - 29 mmol/L   Calcium 8.6 (L) 8.7 - 10.2 mg/dL   Total Protein 6.5 6.0 - 8.5 g/dL   Albumin 4.2 3.9 - 5.0 g/dL   Globulin, Total 2.3 1.5 - 4.5 g/dL   Albumin/Globulin Ratio 1.8 1.2 - 2.2   Bilirubin Total 0.3 0.0 - 1.2 mg/dL   Alkaline Phosphatase 50 42 - 106 IU/L   AST 25 0 - 40 IU/L   ALT 23 0 - 32 IU/L  TSH  Result Value Ref Range   TSH 1.730 0.450 - 4.500 uIU/mL  CBC With Differential  Result Value Ref Range   WBC 3.5 3.4 - 10.8 x10E3/uL   RBC 4.73 3.77 - 5.28 x10E6/uL   Hemoglobin 13.7 11.1 - 15.9 g/dL   Hematocrit 40.8 34.0 - 46.6 %   MCV 86 79 - 97 fL   MCH 29.0 26.6 - 33.0 pg   MCHC 33.6 31.5 - 35.7 g/dL   RDW 12.4 11.7 - 15.4 %   Neutrophils 51 Not Estab. %   Lymphs 40 Not Estab. %   Monocytes 7 Not Estab. %   Eos 1 Not Estab. %   Basos 1 Not Estab. %   Neutrophils Absolute 1.8 1.4 - 7.0 x10E3/uL   Lymphocytes Absolute 1.4 0.7 - 3.1 x10E3/uL   Monocytes Absolute 0.2 0.1 - 0.9 x10E3/uL   EOS (ABSOLUTE) 0.0 0.0 - 0.4 x10E3/uL   Basophils Absolute 0.0 0.0 - 0.2 x10E3/uL   Immature Granulocytes 0 Not Estab. %  Immature Grans (Abs) 0.0 0.0 - 0.1 x10E3/uL      Assessment & Plan:   Problem List Items Addressed  This Visit   None   Visit Diagnoses    Giardia    -  Primary   WIll get stool samples, giardia on vet's microscopic eval- will confirm and treat with flagyl. Call with any concerns.    Relevant Medications   metroNIDAZOLE (FLAGYL) 500 MG tablet   Chronic fatigue       Labs drawn today. Await results. Treat as needed.    Flank pain       Labs drawn today. Await results. Treat as needed.    Screening for cholesterol level       Labs drawn today. Await results. Treat as needed.        Follow up plan: Return if symptoms worsen or fail to improve.  >15 minutes spent with patient today.

## 2020-07-25 LAB — CBC WITH DIFFERENTIAL/PLATELET
Basophils Absolute: 0 10*3/uL (ref 0.0–0.2)
Basos: 1 %
EOS (ABSOLUTE): 0.1 10*3/uL (ref 0.0–0.4)
Eos: 1 %
Hematocrit: 33 % — ABNORMAL LOW (ref 34.0–46.6)
Hemoglobin: 11.2 g/dL (ref 11.1–15.9)
Immature Grans (Abs): 0 10*3/uL (ref 0.0–0.1)
Immature Granulocytes: 0 %
Lymphocytes Absolute: 2.7 10*3/uL (ref 0.7–3.1)
Lymphs: 44 %
MCH: 30.4 pg (ref 26.6–33.0)
MCHC: 33.9 g/dL (ref 31.5–35.7)
MCV: 89 fL (ref 79–97)
Monocytes Absolute: 0.3 10*3/uL (ref 0.1–0.9)
Monocytes: 4 %
Neutrophils Absolute: 3 10*3/uL (ref 1.4–7.0)
Neutrophils: 50 %
Platelets: 255 10*3/uL (ref 150–450)
RBC: 3.69 x10E6/uL — ABNORMAL LOW (ref 3.77–5.28)
RDW: 13.5 % (ref 11.7–15.4)
WBC: 6.1 10*3/uL (ref 3.4–10.8)

## 2020-07-25 LAB — COMPREHENSIVE METABOLIC PANEL
ALT: 14 IU/L (ref 0–32)
AST: 15 IU/L (ref 0–40)
Albumin/Globulin Ratio: 1.9 (ref 1.2–2.2)
Albumin: 3.8 g/dL — ABNORMAL LOW (ref 3.9–5.0)
Alkaline Phosphatase: 37 IU/L — ABNORMAL LOW (ref 42–106)
BUN/Creatinine Ratio: 19 (ref 9–23)
BUN: 10 mg/dL (ref 6–20)
Bilirubin Total: 0.4 mg/dL (ref 0.0–1.2)
CO2: 22 mmol/L (ref 20–29)
Calcium: 8.5 mg/dL — ABNORMAL LOW (ref 8.7–10.2)
Chloride: 106 mmol/L (ref 96–106)
Creatinine, Ser: 0.53 mg/dL — ABNORMAL LOW (ref 0.57–1.00)
Globulin, Total: 2 g/dL (ref 1.5–4.5)
Glucose: 94 mg/dL (ref 65–99)
Potassium: 3.9 mmol/L (ref 3.5–5.2)
Sodium: 141 mmol/L (ref 134–144)
Total Protein: 5.8 g/dL — ABNORMAL LOW (ref 6.0–8.5)
eGFR: 137 mL/min/{1.73_m2} (ref >59)

## 2020-07-25 LAB — LYME AB/WESTERN BLOT REFLEX
LYME DISEASE AB, QUANT, IGM: <0.8 index (ref 0.00–0.79)
Lyme IgG/IgM Ab: <0.91 {ISR} (ref 0.00–0.90)

## 2020-07-25 LAB — TSH: TSH: 1.84 u[IU]/mL (ref 0.450–4.500)

## 2020-07-25 LAB — ROCKY MTN SPOTTED FVR ABS PNL(IGG+IGM)
RMSF IgG: NEGATIVE
RMSF IgM: 0.4 index (ref 0.00–0.89)

## 2020-07-25 LAB — LIPID PANEL W/O CHOL/HDL RATIO
Cholesterol, Total: 158 mg/dL (ref 100–169)
HDL: 52 mg/dL (ref >39)
LDL Chol Calc (NIH): 91 mg/dL (ref 0–109)
Triglycerides: 78 mg/dL (ref 0–89)
VLDL Cholesterol Cal: 15 mg/dL (ref 5–40)

## 2020-07-25 LAB — EHRLICHIA ANTIBODY PANEL
E. Chaffeensis (HME) IgM Titer: NEGATIVE
E.Chaffeensis (HME) IgG: NEGATIVE
HGE IgG Titer: NEGATIVE
HGE IgM Titer: NEGATIVE

## 2020-07-25 LAB — VITAMIN D 25 HYDROXY (VIT D DEFICIENCY, FRACTURES): Vit D, 25-Hydroxy: 23.8 ng/mL — ABNORMAL LOW (ref 30.0–100.0)

## 2020-07-25 LAB — BABESIA MICROTI ANTIBODY PANEL
Babesia microti IgG: <1:10 {titer}
Babesia microti IgM: <1:10 {titer}

## 2020-08-20 ENCOUNTER — Other Ambulatory Visit: Payer: 59

## 2020-08-20 ENCOUNTER — Other Ambulatory Visit: Payer: Self-pay

## 2020-08-20 NOTE — Addendum Note (Signed)
Addended byRudie Meyer on: 08/20/2020 04:03 PM   Modules accepted: Orders

## 2020-08-21 ENCOUNTER — Other Ambulatory Visit: Payer: 59

## 2020-08-24 LAB — OVA AND PARASITE EXAMINATION

## 2020-08-24 LAB — STOOL CULTURE: E coli, Shiga toxin Assay: NEGATIVE

## 2020-08-24 LAB — CLOSTRIDIUM DIFFICILE EIA: C difficile Toxins A+B, EIA: NEGATIVE

## 2020-08-25 LAB — FECAL OCCULT BLOOD, IMMUNOCHEMICAL: Fecal Occult Bld: NEGATIVE

## 2020-11-19 ENCOUNTER — Telehealth: Payer: Self-pay | Admitting: *Deleted

## 2020-11-19 NOTE — Telephone Encounter (Signed)
Pt mom dropped off form for daughter school to be completed by Santiago Glad. Please call [place] (mom) to pick up Mom stated daughter needs form in 2 days please Placed in BIN to be completed

## 2020-11-19 NOTE — Telephone Encounter (Signed)
Placed form in provider folder

## 2020-11-19 NOTE — Telephone Encounter (Signed)
Scheduled patient appointment, placed form in [place].

## 2020-11-28 ENCOUNTER — Encounter: Payer: Self-pay | Admitting: Nurse Practitioner

## 2020-11-28 ENCOUNTER — Ambulatory Visit: Payer: 59 | Admitting: Nurse Practitioner

## 2020-11-28 ENCOUNTER — Other Ambulatory Visit: Payer: Self-pay

## 2020-11-28 VITALS — BP 109/70 | HR 76 | Temp 98.3°F | Ht 65.35 in | Wt 135.2 lb

## 2020-11-28 DIAGNOSIS — Z111 Encounter for screening for respiratory tuberculosis: Secondary | ICD-10-CM

## 2020-11-28 DIAGNOSIS — Z0289 Encounter for other administrative examinations: Secondary | ICD-10-CM | POA: Diagnosis not present

## 2020-11-28 NOTE — Progress Notes (Signed)
BP 109/70   Pulse 76   Temp 98.3 F (36.8 C)   Ht 5' 5.35" (1.66 m)   Wt 135 lb 4 oz (61.3 kg)   SpO2 98%   BMI 22.26 kg/m    Subjective:    Patient ID: Marilyn Lee, female    DOB: 10-Mar-2001, 20 y.o.   MRN: [id number]  HPI: Marilyn Lee is a 20 y.o. female  Chief Complaint  Patient presents with   Form Completion    Patient presents to clinic to have form completed by PCP.  Discussed with patient that she will need a TB screening done through blood in order for form to be complete.  Patient denies concerns at visit today.   Denies HA, CP, SOB, dizziness, palpitations, visual changes, and lower extremity swelling.  Relevant past medical, surgical, family and social history reviewed and updated as indicated. Interim medical history since our last visit reviewed. Allergies and medications reviewed and updated.  Review of Systems  Eyes:  Negative for visual disturbance.  Respiratory:  Negative for cough, chest tightness and shortness of breath.   Cardiovascular:  Negative for chest pain, palpitations and leg swelling.  Neurological:  Negative for dizziness and headaches.   Per HPI unless specifically indicated above     Objective:    BP 109/70   Pulse 76   Temp 98.3 F (36.8 C)   Ht 5' 5.35" (1.66 m)   Wt 135 lb 4 oz (61.3 kg)   SpO2 98%   BMI 22.26 kg/m   Wt Readings from Last 3 Encounters:  11/28/20 135 lb 4 oz (61.3 kg)  07/20/20 130 lb 9.6 oz (59.2 kg) (55 %, Z= 0.12)*  05/31/20 131 lb 9.6 oz (59.7 kg) (57 %, Z= 0.18)*   * Growth percentiles are based on CDC (Girls, 2-20 Years) data.    Physical Exam Vitals and nursing note reviewed.  Constitutional:      General: She is not in acute distress.    Appearance: Normal appearance. She is normal weight. She is not ill-appearing, toxic-appearing or diaphoretic.  HENT:     Head: Normocephalic.     Right Ear: External ear normal.     Left Ear: External ear normal.     Nose: Nose normal.     Mouth/Throat:      Mouth: Mucous membranes are moist.     Pharynx: Oropharynx is clear.  Eyes:     General:        Right eye: No discharge.        Left eye: No discharge.     Extraocular Movements: Extraocular movements intact.     Conjunctiva/sclera: Conjunctivae normal.     Pupils: Pupils are equal, round, and reactive to light.  Cardiovascular:     Rate and Rhythm: Normal rate and regular rhythm.     Heart sounds: No murmur heard. Pulmonary:     Effort: Pulmonary effort is normal. No respiratory distress.     Breath sounds: Normal breath sounds. No wheezing or rales.  Musculoskeletal:     Cervical back: Normal range of motion and neck supple.  Skin:    General: Skin is warm and dry.     Capillary Refill: Capillary refill takes less than 2 seconds.  Neurological:     General: No focal deficit present.     Mental Status: She is alert and oriented to person, place, and time. Mental status is at baseline.  Psychiatric:  Mood and Affect: Mood normal.        Behavior: Behavior normal.        Thought Content: Thought content normal.        Judgment: Judgment normal.    Results for orders placed or performed in visit on 07/20/20  Microscopic Examination   Urine  Result Value Ref Range   WBC, UA None seen 0 - 5 /hpf   RBC None seen 0 - 2 /hpf   Epithelial Cells (non renal) None seen 0 - 10 /hpf   Bacteria, UA None seen None seen/Few  Fecal occult blood, imunochemical   Specimen: Stool   STO  Result Value Ref Range   Fecal Occult Bld Negative Negative  Stool C-Diff Toxin Assay   Specimen: Stool   ST     CD- [id number] MZ  Result Value Ref Range   C difficile Toxins A+B, EIA Negative Negative  Stool culture   ST     CD- [id number] MZ  Result Value Ref Range   Salmonella/Shigella Screen Final report    Stool Culture result 1 (RSASHR) Comment    Campylobacter Culture Final report    Stool Culture result 1 (CMPCXR) Comment    E coli, Shiga toxin Assay Negative Negative  Ova and  parasite examination   ST     CD- [id number] MZ  Result Value Ref Range   OVA + PARASITE EXAM Final report    O&P result 1 Comment   Ehrlichia Antibody Panel  Result Value Ref Range   E.Chaffeensis (HME) IgG Negative Neg:<1:64   E. Chaffeensis ([facility]ME) IgM Titer Negative Neg:<1:20   HGE IgG Titer Negative Neg:<1:64   HGE IgM Titer Negative Neg:<1:20  Babesia microti Antibody Panel  Result Value Ref Range   Babesia microti IgM <1:10 Neg:<1:10   Babesia microti IgG <1:10 Neg:<1:10  Urinalysis, Routine w reflex microscopic  Result Value Ref Range   Specific Gravity, UA 1.020 1.005 - 1.030   pH, UA 7.0 5.0 - 7.5   Color, UA Yellow Yellow   Appearance Ur Cloudy (A) Clear   Leukocytes,UA Trace (A) Negative   Protein,UA Negative Negative/Trace   Glucose, UA Negative Negative   Ketones, UA Negative Negative   RBC, UA Negative Negative   Bilirubin, UA Negative Negative   Urobilinogen, Ur 0.2 0.2 - 1.0 mg/dL   Nitrite, UA Negative Negative   Microscopic Examination See below:   VITAMIN D 25 Hydroxy (Vit-D Deficiency, Fractures)  Result Value Ref Range   Vit D, 25-Hydroxy 23.8 (L) 30.0 - 100.0 ng/mL  TSH  Result Value Ref Range   TSH 1.840 0.450 - 4.500 uIU/mL  Lipid Panel w/o Chol/HDL Ratio  Result Value Ref Range   Cholesterol, Total 158 100 - 169 mg/dL   Triglycerides 78 0 - 89 mg/dL   HDL 52 >39 mg/dL   VLDL Cholesterol Cal 15 5 - 40 mg/dL   LDL Chol Calc (NIH) 91 0 - 109 mg/dL  CBC with Differential/Platelet  Result Value Ref Range   WBC 6.1 3.4 - 10.8 x10E3/uL   RBC 3.69 (L) 3.77 - 5.28 x10E6/uL   Hemoglobin 11.2 11.1 - 15.9 g/dL   Hematocrit 33.0 (L) 34.0 - 46.6 %   MCV 89 79 - 97 fL   MCH 30.4 26.6 - 33.0 pg   MCHC 33.9 31.5 - 35.7 g/dL   RDW 13.5 11.7 - 15.4 %   Platelets 255 150 - 450 x10E3/uL   Neutrophils 50 Not Estab. %   Lymphs 44 Not  Estab. %   Monocytes 4 Not Estab. %   Eos 1 Not Estab. %   Basos 1 Not Estab. %   Neutrophils Absolute 3.0 1.4 - 7.0 x10E3/uL    Lymphocytes Absolute 2.7 0.7 - 3.1 x10E3/uL   Monocytes Absolute 0.3 0.1 - 0.9 x10E3/uL   EOS (ABSOLUTE) 0.1 0.0 - 0.4 x10E3/uL   Basophils Absolute 0.0 0.0 - 0.2 x10E3/uL   Immature Granulocytes 0 Not Estab. %   Immature Grans (Abs) 0.0 0.0 - 0.1 x10E3/uL  Comprehensive metabolic panel  Result Value Ref Range   Glucose 94 65 - 99 mg/dL   BUN 10 6 - 20 mg/dL   Creatinine, Ser 0.53 (L) 0.57 - 1.00 mg/dL   eGFR 137 >59 mL/min/1.73   BUN/Creatinine Ratio 19 9 - 23   Sodium 141 134 - 144 mmol/L   Potassium 3.9 3.5 - 5.2 mmol/L   Chloride 106 96 - 106 mmol/L   CO2 22 20 - 29 mmol/L   Calcium 8.5 (L) 8.7 - 10.2 mg/dL   Total Protein 5.8 (L) 6.0 - 8.5 g/dL   Albumin 3.8 (L) 3.9 - 5.0 g/dL   Globulin, Total 2.0 1.5 - 4.5 g/dL   Albumin/Globulin Ratio 1.9 1.2 - 2.2   Bilirubin Total 0.4 0.0 - 1.2 mg/dL   Alkaline Phosphatase 37 (L) 42 - 106 IU/L   AST 15 0 - 40 IU/L   ALT 14 0 - 32 IU/L  Rocky mtn spotted fvr abs pnl(IgG+IgM)  Result Value Ref Range   RMSF IgG Negative Negative   RMSF IgM 0.40 0.00 - 0.89 index  Lyme Ab/Western Blot Reflex  Result Value Ref Range   Lyme IgG/IgM Ab <0.91 0.00 - 0.90 ISR   LYME DISEASE AB, QUANT, IGM <0.80 0.00 - 0.79 index      Assessment & Plan:   Problem List Items Addressed This Visit   None Visit Diagnoses     Encounter for completion of form with patient    -  Primary   Vaccines filled out during visit today. Will complete form when TB screening returns.  Patient understands and agrees with the plan of care.    Screening for tuberculosis       Will make recommendations based on lab reuslts.    Relevant Orders   QuantiFERON-TB Gold Plus        Follow up plan: Return if symptoms worsen or fail to improve.

## 2020-12-06 LAB — QUANTIFERON-TB GOLD PLUS
QuantiFERON Mitogen Value: >10 IU/mL
QuantiFERON Nil Value: 0 IU/mL
QuantiFERON TB1 Ag Value: 0 IU/mL
QuantiFERON TB2 Ag Value: 0.03 IU/mL
QuantiFERON-TB Gold Plus: NEGATIVE

## 2021-01-04 ENCOUNTER — Other Ambulatory Visit: Payer: Self-pay

## 2021-01-04 ENCOUNTER — Ambulatory Visit: Payer: 59 | Admitting: Nurse Practitioner

## 2021-01-04 ENCOUNTER — Encounter: Payer: Self-pay | Admitting: Nurse Practitioner

## 2021-01-04 ENCOUNTER — Ambulatory Visit: Payer: Self-pay

## 2021-01-04 VITALS — BP 109/68 | HR 70 | Temp 98.3°F | Ht 65.0 in | Wt 137.8 lb

## 2021-01-04 DIAGNOSIS — L731 Pseudofolliculitis barbae: Secondary | ICD-10-CM

## 2021-01-04 DIAGNOSIS — R197 Diarrhea, unspecified: Secondary | ICD-10-CM

## 2021-01-04 MED ORDER — SULFAMETHOXAZOLE-TRIMETHOPRIM 800-160 MG PO TABS: 1.0000 | ORAL_TABLET | Freq: Two times a day (BID) | ORAL | 0 refills | Status: DC

## 2021-01-04 NOTE — Telephone Encounter (Signed)
FYI for appointment today.  

## 2021-01-04 NOTE — Progress Notes (Signed)
BP 109/68   Pulse 70   Temp 98.3 F (36.8 C) (Oral)   Ht '5\' 5"'$  (1.651 m)   Wt 137 lb 12.8 oz (62.5 kg)   SpO2 99%   BMI 22.93 kg/m    Subjective:    Patient ID: Marilyn Lee, female    DOB: 03/07/2001, 20 y.o.   MRN: [id number]  HPI: Marilyn Lee is a 20 y.o. female  Chief Complaint  Patient presents with   Diarrhea   Letter for School/Work   Patient presents to clinic today due to diarrhea last night. States it only happened one time and has resolved.  Denies any residual abdominal pain, diarrhea or nausea.  Patient has an open sore on her chest that has been pussing for about 1 week.  Denies fever or redness and warmth.   Relevant past medical, surgical, family and social history reviewed and updated as indicated. Interim medical history since our last visit reviewed. Allergies and medications reviewed and updated.  Review of Systems  Gastrointestinal:  Negative for abdominal pain, diarrhea and nausea.  Skin:        Open area on chest   Per HPI unless specifically indicated above     Objective:    BP 109/68   Pulse 70   Temp 98.3 F (36.8 C) (Oral)   Ht '5\' 5"'$  (1.651 m)   Wt 137 lb 12.8 oz (62.5 kg)   SpO2 99%   BMI 22.93 kg/m   Wt Readings from Last 3 Encounters:  01/04/21 137 lb 12.8 oz (62.5 kg)  11/28/20 135 lb 4 oz (61.3 kg)  07/20/20 130 lb 9.6 oz (59.2 kg) (55 %, Z= 0.12)*   * Growth percentiles are based on CDC (Girls, 2-20 Years) data.    Physical Exam Vitals and nursing note reviewed.  Constitutional:      General: She is not in acute distress.    Appearance: Normal appearance. She is normal weight. She is not ill-appearing, toxic-appearing or diaphoretic.  HENT:     Head: Normocephalic.     Right Ear: External ear normal.     Left Ear: External ear normal.     Nose: Nose normal.     Mouth/Throat:     Mouth: Mucous membranes are moist.     Pharynx: Oropharynx is clear.  Eyes:     General:        Right eye: No discharge.         Left eye: No discharge.     Extraocular Movements: Extraocular movements intact.     Conjunctiva/sclera: Conjunctivae normal.     Pupils: Pupils are equal, round, and reactive to light.  Cardiovascular:     Rate and Rhythm: Normal rate and regular rhythm.     Heart sounds: No murmur heard. Pulmonary:     Effort: Pulmonary effort is normal. No respiratory distress.     Breath sounds: Normal breath sounds. No wheezing or rales.  Abdominal:     General: Abdomen is flat. Bowel sounds are normal. There is no distension.     Palpations: Abdomen is soft.     Tenderness: There is no abdominal tenderness. There is no guarding.  Musculoskeletal:     Cervical back: Normal range of motion and neck supple.  Skin:    General: Skin is warm and dry.     Capillary Refill: Capillary refill takes less than 2 seconds.       Neurological:     General: No focal  deficit present.     Mental Status: She is alert and oriented to person, place, and time. Mental status is at baseline.  Psychiatric:        Mood and Affect: Mood normal.        Behavior: Behavior normal.        Thought Content: Thought content normal.        Judgment: Judgment normal.    Results for orders placed or performed in visit on 11/28/20  QuantiFERON-TB Gold Plus  Result Value Ref Range   QuantiFERON Incubation Incubation performed.    QuantiFERON Criteria Comment    QuantiFERON TB1 Ag Value 0.00 IU/mL   QuantiFERON TB2 Ag Value 0.03 IU/mL   QuantiFERON Nil Value 0.00 IU/mL   QuantiFERON Mitogen Value >10.00 IU/mL   QuantiFERON-TB Gold Plus Negative Negative      Assessment & Plan:   Problem List Items Addressed This Visit   None Visit Diagnoses     Ingrown hair    -  Primary   Bactrim sent for patient due to pus and ongoing open sore for 1 week. Complete course of antibiotics. RTC if symptoms worsen or fail to improve.   Diarrhea, unspecified type       Resolved. Patient needed note for school.         Follow up  plan: Return if symptoms worsen or fail to improve.

## 2021-01-04 NOTE — Telephone Encounter (Signed)
Pt. Reports she had abdominal pain and diarrhea in the middle of the night. States she feels better, but needs a note for missing school. Agent made appointment for today.    Answer Assessment - Initial Assessment Questions 1. LOCATION: "Where does it hurt?"      Low 2. RADIATION: "Does the pain shoot anywhere else?" (e.g., chest, back)     No 3. ONSET: "When did the pain begin?" (e.g., minutes, hours or days ago)      Last night 4. SUDDEN: "Gradual or sudden onset?"     Sudden 5. PATTERN "Does the pain come and go, or is it constant?"    - If constant: "Is it getting better, staying the same, or worsening?"      (Note: Constant means the pain never goes away completely; most serious pain is constant and it progresses)     - If intermittent: "How long does it last?" "Do you have pain now?"     (Note: Intermittent means the pain goes away completely between bouts)     Comes and goes 6. SEVERITY: "How bad is the pain?"  (e.g., Scale 1-10; mild, moderate, or severe)   - MILD (1-3): doesn't interfere with normal activities, abdomen soft and not tender to touch    - MODERATE (4-7): interferes with normal activities or awakens from sleep, abdomen tender to touch    - SEVERE (8-10): excruciating pain, doubled over, unable to do any normal activities      No pain now 7. RECURRENT SYMPTOM: "Have you ever had this type of stomach pain before?" If Yes, ask: "When was the last time?" and "What happened that time?"      Yes 8. CAUSE: "What do you think is causing the stomach pain?"     Unsure 9. RELIEVING/AGGRAVATING FACTORS: "What makes it better or worse?" (e.g., movement, antacids, bowel movement)     No 10. OTHER SYMPTOMS: "Do you have any other symptoms?" (e.g., back pain, diarrhea, fever, urination pain, vomiting)       Diarrhea 11. PREGNANCY: "Is there any chance you are pregnant?" "When was your last menstrual period?"       No  Protocols used: Abdominal Pain - [facility]

## 2021-02-03 ENCOUNTER — Other Ambulatory Visit: Payer: Self-pay | Admitting: Obstetrics and Gynecology

## 2021-02-03 DIAGNOSIS — Z30011 Encounter for initial prescription of contraceptive pills: Secondary | ICD-10-CM

## 2021-02-08 MED ORDER — MICROGESTIN 24 FE 1-20 MG-MCG PO TABS: 1.0000 | ORAL_TABLET | Freq: Every day | ORAL | 0 refills | Status: DC

## 2021-02-08 NOTE — Telephone Encounter (Signed)
Pt calling; has scheduled annual; needs refill of bc.  [phone]  Pt aware refill eRx'd.

## 2021-02-08 NOTE — Addendum Note (Signed)
Addended by: Cleophas Dunker D on: 02/08/2021 04:24 PM   Modules accepted: Orders

## 2021-02-12 ENCOUNTER — Ambulatory Visit: Payer: 59 | Admitting: Obstetrics and Gynecology

## 2021-02-12 NOTE — Progress Notes (Deleted)
   PCP:  Jon Billings, NP   No chief complaint on file.    HPI:      [name] is a 20 y.o. G0P0000 whose LMP was No LMP recorded., presents today for her annual examination.  Her menses are {norm/abn:715}, lasting {number: 22536} days.  Dysmenorrhea {dysmen:716}. She {does:18564} have intermenstrual bleeding. On Loestrin 24 2021, had nausea?  Sex activity: {sex active: 315163}.  Last Pap: N/A due to age Hx of STDs: {STD hx:14358}  There is no FH of breast cancer. There is no FH of ovarian cancer. The patient {does:18564} do self-breast exams.  Tobacco use: {tob:20664} Alcohol use: {Alcohol:11675} No drug use.  Exercise: {exercise:31265}  She {does:18564} get adequate calcium and Vitamin D in her diet.  Past Medical History:  Diagnosis Date   Mononucleosis    Vaccine for human papilloma virus (HPV) types 6, 39, 5, and 18 administered     Past Surgical History:  Procedure Laterality Date   NASAL SEPTUM SURGERY     WISDOM TOOTH EXTRACTION     WISDOM TOOTH EXTRACTION      Family History  Problem Relation Age of Onset   Hypertension Father    Lung cancer Maternal Grandmother    Hypertension Maternal Grandmother    Hypertension Mother    Asthma Sister    Allergies Sister    Heart disease Paternal Grandmother    Pneumonia Paternal Grandmother    Breast cancer Neg Hx    Ovarian cancer Neg Hx     Social History   Socioeconomic History   Marital status: Single    Spouse name: Not on file   Number of children: Not on file   Years of education: Not on file   Highest education level: Not on file  Occupational History   Not on file  Tobacco Use   Smoking status: Never   Smokeless tobacco: Never  Vaping Use   Vaping Use: Never used  Substance and Sexual Activity   Alcohol use: No   Drug use: No   Sexual activity: Yes    Birth control/protection: Condom  Other Topics Concern   Not on file  Social History Narrative   Not on file   Social  Determinants of Health   Financial Resource Strain: Not on file  Food Insecurity: Not on file  Transportation Needs: Not on file  Physical Activity: Not on file  Stress: Not on file  Social Connections: Not on file  Intimate Partner Violence: Not on file     Current Outpatient Medications:    meloxicam (MOBIC) 7.5 MG tablet, Take 7.5 mg by mouth daily. (Patient not taking: Reported on 01/04/2021), Disp: , Rfl:    Norethindrone Acetate-Ethinyl Estrad-FE (MICROGESTIN 24 FE) 1-20 MG-MCG(24) tablet, Take 1 tablet by mouth daily., Disp: 84 tablet, Rfl: 0   sulfamethoxazole-trimethoprim (BACTRIM DS) 800-160 MG tablet, Take 1 tablet by mouth 2 (two) times daily., Disp: 14 tablet, Rfl: 0     ROS:  Review of Systems BREAST: No symptoms   Objective: There were no vitals taken for this visit.   OBGyn Exam  Results: No results found for this or any previous visit (from the past 24 hour(s)).  Assessment/Plan: No diagnosis found.  No orders of the defined types were placed in this encounter.            GYN counsel {counseling: 16159}     F/U  No follow-ups on file.  Aicha Clingenpeel B. Amillia Biffle, PA-C 02/12/2021 9:39 AM

## 2021-03-12 ENCOUNTER — Encounter: Payer: Self-pay | Admitting: Nurse Practitioner

## 2021-03-12 ENCOUNTER — Other Ambulatory Visit: Payer: Self-pay

## 2021-03-12 ENCOUNTER — Ambulatory Visit
Admission: RE | Admit: 2021-03-12 | Discharge: 2021-03-12 | Disposition: A | Discharge status: Home / Self Care | Payer: 59 | Source: Ambulatory Visit

## 2021-03-12 VITALS — BP 116/80 | HR 72 | Temp 98.7°F | Resp 16

## 2021-03-12 DIAGNOSIS — J111 Influenza due to unidentified influenza virus with other respiratory manifestations: Secondary | ICD-10-CM

## 2021-03-12 NOTE — ED Triage Notes (Signed)
Patient presents to Urgent Care with complaints of headache, light headedness, sore throat since 5 days ago. Treating with OTC cold med with no improvement. Had a negative covid test yesterday.   Denies fever.

## 2021-03-12 NOTE — ED Provider Notes (Signed)
[facility]    CSN: [id number] Arrival date & time: 03/12/21  1046      History   Chief Complaint Chief Complaint  Patient presents with   Headache   Sore Throat   Dizziness   Appointment    1100    HPI Marilyn Lee is a 20 y.o. female presenting with viral syndrome x5 days. Medical history noncontributory.  Describes headaches, lightheadedness, generalized body aches, nausea without vomiting, sore throat.  Symptoms overall improving, but she needs a note for school.  Has been taking over-the-counter medications with relief.  Temperature was as high as 100 at home per home thermometer.  HPI  History reviewed. No pertinent past medical history.  There are no problems to display for this patient.   History reviewed. No pertinent surgical history.  OB History   No obstetric history on file.      Home Medications    Prior to Admission medications   Not on File    Family History History reviewed. No pertinent family history.  Social History Social History   Tobacco Use   Smoking status: Never   Smokeless tobacco: Never  Vaping Use   Vaping Use: Never used  Substance Use Topics   Alcohol use: Never   Drug use: Never     Allergies   Patient has no known allergies.   Review of Systems Review of Systems  Constitutional:  Positive for chills. Negative for appetite change and fever.  HENT:  Positive for congestion and sore throat. Negative for ear pain, rhinorrhea, sinus pressure and sinus pain.   Eyes:  Negative for redness and visual disturbance.  Respiratory:  Positive for cough. Negative for chest tightness, shortness of breath and wheezing.   Cardiovascular:  Negative for chest pain and palpitations.  Gastrointestinal:  Negative for abdominal pain, constipation, diarrhea, nausea and vomiting.  Genitourinary:  Negative for dysuria, frequency and urgency.  Musculoskeletal:  Positive for myalgias.  Neurological:  Negative for dizziness,  weakness and headaches.  Psychiatric/Behavioral:  Negative for confusion.   All other systems reviewed and are negative.   Physical Exam Triage Vital Signs ED Triage Vitals  Enc Vitals Group     BP 03/12/21 1143 116/80     Pulse Rate 03/12/21 1143 72     Resp 03/12/21 1143 16     Temp 03/12/21 1143 98.7 F (37.1 C)     Temp Source 03/12/21 1143 Temporal     SpO2 03/12/21 1143 98 %     Weight --      Height --      Head Circumference --      Peak Flow --      Pain Score 03/12/21 1146 0     Pain Loc --      Pain Edu? --      Excl. in [place]? --    No data found.  Updated Vital Signs BP 116/80 (BP Location: Right Arm)   Pulse 72   Temp 98.7 F (37.1 C) (Temporal)   Resp 16   LMP 03/08/2021   SpO2 98%   Visual Acuity Right Eye Distance:   Left Eye Distance:   Bilateral Distance:    Right Eye Near:   Left Eye Near:    Bilateral Near:     Physical Exam Vitals reviewed.  Constitutional:      General: She is not in acute distress.    Appearance: Normal appearance. She is not ill-appearing.  HENT:  Head: Normocephalic and atraumatic.     Right Ear: Tympanic membrane, ear canal and external ear normal. No tenderness. No middle ear effusion. There is no impacted cerumen. Tympanic membrane is not perforated, erythematous, retracted or bulging.     Left Ear: Tympanic membrane, ear canal and external ear normal. No tenderness.  No middle ear effusion. There is no impacted cerumen. Tympanic membrane is not perforated, erythematous, retracted or bulging.     Nose: Nose normal. No congestion.     Mouth/Throat:     Mouth: Mucous membranes are moist.     Pharynx: Uvula midline. No oropharyngeal exudate or posterior oropharyngeal erythema.  Eyes:     Extraocular Movements: Extraocular movements intact.     Pupils: Pupils are equal, round, and reactive to light.  Cardiovascular:     Rate and Rhythm: Normal rate and regular rhythm.     Heart sounds: Normal heart sounds.   Pulmonary:     Effort: Pulmonary effort is normal.     Breath sounds: Normal breath sounds. No decreased breath sounds, wheezing, rhonchi or rales.  Abdominal:     Palpations: Abdomen is soft.     Tenderness: There is no abdominal tenderness. There is no guarding or rebound.  Lymphadenopathy:     Cervical: No cervical adenopathy.     Right cervical: No superficial cervical adenopathy.    Left cervical: No superficial cervical adenopathy.  Neurological:     General: No focal deficit present.     Mental Status: She is alert and oriented to person, place, and time.  Psychiatric:        Mood and Affect: Mood normal.        Behavior: Behavior normal.        Thought Content: Thought content normal.        Judgment: Judgment normal.     UC Treatments / Results  Labs (all labs ordered are listed, but only abnormal results are displayed) Labs Reviewed - No data to display  EKG   Radiology No results found.  Procedures Procedures (including critical care time)  Medications Ordered in UC Medications - No data to display  Initial Impression / Assessment and Plan / UC Course  I have reviewed the triage vital signs and the nursing notes.  Pertinent labs & imaging results that were available during my care of the patient were reviewed by me and considered in my medical decision making (see chart for details).     This patient is a very pleasant 20 y.o. year old female presenting with influenza, improving on its own. Today this pt is afebrile nontachycardic nontachypneic, oxygenating well on room air, no wheezes rhonchi or rales.   Declines influenza testing given duration of symptoms.   School note provided. ED return precautions discussed. Patient verbalizes understanding and agreement.    Final Clinical Impressions(s) / UC Diagnoses   Final diagnoses:  Influenza with respiratory manifestation     Discharge Instructions      -For fevers/chills, bodyaches, headaches-  You can take Tylenol up to 1000 mg 3 times daily, and ibuprofen up to 600 mg 3 times daily with food.  You can take these together, or alternate every 3-4 hours. -Drink plenty of water/gatorade and get plenty of rest -With a virus, you're typically contagious for 5-7 days, or as long as you're having fevers.  -Come back and see [place] if things are getting worse instead of better, like shortness of breath, chest pain, fevers and chills that are getting  higher instead of lower and do not come down with Tylenol or ibuprofen, etc.      ED Prescriptions   None    PDMP not reviewed this encounter.   Hazel Sams, PA-C 03/12/21 1213

## 2021-03-12 NOTE — Discharge Instructions (Addendum)
-  For fevers/chills, bodyaches, headaches- You can take Tylenol up to 1000 mg 3 times daily, and ibuprofen up to 600 mg 3 times daily with food.  You can take these together, or alternate every 3-4 hours. -Drink plenty of water/gatorade and get plenty of rest -With a virus, you're typically contagious for 5-7 days, or as long as you're having fevers.  -Come back and see [place] if things are getting worse instead of better, like shortness of breath, chest pain, fevers and chills that are getting higher instead of lower and do not come down with Tylenol or ibuprofen, etc.

## 2021-04-02 ENCOUNTER — Emergency Department
Admission: EM | Admit: 2021-04-02 | Discharge: 2021-04-02 | Disposition: A | Discharge status: Home / Self Care | Payer: 59 | Attending: Emergency Medicine | Admitting: Emergency Medicine

## 2021-04-02 ENCOUNTER — Other Ambulatory Visit: Payer: Self-pay

## 2021-04-02 DIAGNOSIS — N72 Inflammatory disease of cervix uteri: Secondary | ICD-10-CM

## 2021-04-02 DIAGNOSIS — R55 Syncope and collapse: Secondary | ICD-10-CM | POA: Diagnosis not present

## 2021-04-02 LAB — POC URINE PREG, ED: Preg Test, Ur: NEGATIVE

## 2021-04-02 LAB — URINALYSIS, ROUTINE W REFLEX MICROSCOPIC
Glucose, UA: NEGATIVE mg/dL
Ketones, ur: 80 mg/dL — AB
Nitrite: NEGATIVE
Protein, ur: 30 mg/dL — AB
Specific Gravity, Urine: >1.03 — ABNORMAL HIGH (ref 1.005–1.030)
pH: 6 (ref 5.0–8.0)

## 2021-04-02 LAB — CBC
HCT: 48 % — ABNORMAL HIGH (ref 36.0–46.0)
Hemoglobin: 16.3 g/dL — ABNORMAL HIGH (ref 12.0–15.0)
MCH: 30.8 pg (ref 26.0–34.0)
MCHC: 34 g/dL (ref 30.0–36.0)
MCV: 90.6 fL (ref 80.0–100.0)
Platelets: 242 10*3/uL (ref 150–400)
RBC: 5.3 MIL/uL — ABNORMAL HIGH (ref 3.87–5.11)
RDW: 12.6 % (ref 11.5–15.5)
WBC: 10 10*3/uL (ref 4.0–10.5)
nRBC: 0 % (ref 0.0–0.2)

## 2021-04-02 LAB — BASIC METABOLIC PANEL
Anion gap: 8 (ref 5–15)
BUN: 10 mg/dL (ref 6–20)
CO2: 22 mmol/L (ref 22–32)
Calcium: 8.4 mg/dL — ABNORMAL LOW (ref 8.9–10.3)
Chloride: 107 mmol/L (ref 98–111)
Creatinine, Ser: 0.51 mg/dL (ref 0.44–1.00)
GFR, Estimated: >60 mL/min (ref >60)
Glucose, Bld: 85 mg/dL (ref 70–99)
Potassium: 3.9 mmol/L (ref 3.5–5.1)
Sodium: 137 mmol/L (ref 135–145)

## 2021-04-02 LAB — WET PREP, GENITAL
Clue Cells Wet Prep HPF POC: NONE SEEN
Sperm: NONE SEEN
Trich, Wet Prep: NONE SEEN
WBC, Wet Prep HPF POC: <10 (ref <10)
Yeast Wet Prep HPF POC: NONE SEEN

## 2021-04-02 LAB — URINALYSIS, MICROSCOPIC (REFLEX): Bacteria, UA: NONE SEEN

## 2021-04-02 LAB — CHLAMYDIA/NGC RT PCR (ARMC ONLY)
Chlamydia Tr: NOT DETECTED
N gonorrhoeae: NOT DETECTED

## 2021-04-02 MED ORDER — DOXYCYCLINE HYCLATE 100 MG PO CAPS: 100.0000 mg | ORAL_CAPSULE | Freq: Two times a day (BID) | ORAL | 0 refills | Status: AC

## 2021-04-02 MED ORDER — CEFTRIAXONE SODIUM 1 G IJ SOLR
500.0000 mg | Freq: Once | INTRAMUSCULAR | Status: AC
  Administered 2021-04-02: 500 mg via INTRAMUSCULAR
  Filled 2021-04-02: qty 10

## 2021-04-02 NOTE — ED Notes (Signed)
Pt reports syncopal episode today. Pt states she was nauseous prior to syncopal episode & was dizzy & SHOB after. Pt also reports vaginal bleeding/spotting x1 wk & her menstrual cycle is not due to start until this Friday.

## 2021-04-02 NOTE — ED Triage Notes (Signed)
Pt to ED [facility] from home for syncopal episode at home after giving plasma earlier this am. Pt also reports minimal vaginal bleeding x1 week, states not enough bleeding to wear a pad.  NAD noted. Clear speech. Alert and oriented.   States unsure if hit head  because does not remember fall, no hematoma or injuries noted.

## 2021-04-02 NOTE — ED Provider Notes (Signed)
[facility] Emergency Department Provider Note  ____________________________________________   Event Date/Time   First MD Initiated Contact with Patient 04/02/21 1921     (approximate)  I have reviewed the triage vital signs and the nursing notes.   HISTORY  Chief Complaint Loss of Consciousness   HPI Marilyn Lee is a 20 y.o. female without significant past medical history who presents accompanied by her mother for assessment after a syncopal episode that occurred earlier today as well as vaginal bleeding that seems has been ongoing over the past week.  With regard to the syncopal episode today occurred while patient was in the shower.  She had donated some plasma earlier today but states she does this regularly and has never passed out before.  She remembers feeling a little bit nauseous and dizzy for waking up on the floor.  She states she felt a little short of breath for about a minute then recovered completely and has not had any shortness of breath, dizziness or other persistent symptoms other than soreness around her nose.  Patient states she think she probably hit the front of her face.  Other than the soreness around her nose she denies any significant headache, neck pain, back pain, chest pain, abdominal pain or extremity pain.  No other recent passing out episodes or seizure history.  Patient denies any tongue biting or urinary incontinence.  She does note she has had some mild vaginal spotting over the past week and little red discharge.  She has not had any abdominal pain, back pain rash or extremity pain.  No recent falls or injuries.  Denies EtOH use or illicit drug use.  Has been eating and drinking normally.  No other acute concerns at this time.         Past Medical History:  Diagnosis Date   Mononucleosis    Vaccine for human papilloma virus (HPV) types 6, 11, 16, and 18 administered     Patient Active Problem List   Diagnosis Date Noted    Abdominal pain 05/03/2020   Mononucleosis    Back pain 04/30/2017   Raynaud's phenomenon 02/21/2017    Past Surgical History:  Procedure Laterality Date   NASAL SEPTUM SURGERY     WISDOM TOOTH EXTRACTION     WISDOM TOOTH EXTRACTION      Prior to Admission medications   Medication Sig Start Date End Date Taking? Authorizing Provider  doxycycline (VIBRAMYCIN) 100 MG capsule Take 1 capsule (100 mg total) by mouth 2 (two) times daily for 14 days. 04/02/21 04/16/21 Yes Lucrezia Starch, MD  meloxicam (MOBIC) 7.5 MG tablet Take 7.5 mg by mouth daily. Patient not taking: Reported on 01/04/2021 11/14/20   [provider]  Norethindrone Acetate-Ethinyl Estrad-FE (MICROGESTIN 24 FE) 1-20 MG-MCG(24) tablet Take 1 tablet by mouth daily. 35/70/17   Copland, Deirdre Evener, PA-C    Allergies Patient has no known allergies.  Family History  Problem Relation Age of Onset   Hypertension Father    Lung cancer Maternal Grandmother    Hypertension Maternal Grandmother    Hypertension Mother    Asthma Sister    Allergies Sister    Heart disease Paternal Grandmother    Pneumonia Paternal Grandmother    Breast cancer Neg Hx    Ovarian cancer Neg Hx     Social History Social History   Tobacco Use   Smoking status: Never   Smokeless tobacco: Never  Vaping Use   Vaping Use: Never used  Substance Use Topics   Alcohol use: Never   Drug use: Never    Review of Systems  Review of Systems  Constitutional:  Negative for chills and fever.  HENT:  Negative for sore throat.   Eyes:  Negative for pain.  Respiratory:  Negative for cough and stridor.   Cardiovascular:  Negative for chest pain.  Gastrointestinal:  Negative for vomiting.  Genitourinary:  Negative for dysuria.  Musculoskeletal:  Positive for myalgias (nose).  Skin:  Negative for rash.  Neurological:  Positive for loss of consciousness. Negative for seizures and headaches.  Psychiatric/Behavioral:  Negative for suicidal  ideas.   All other systems reviewed and are negative.    ____________________________________________   PHYSICAL EXAM:  VITAL SIGNS: ED Triage Vitals [04/02/21 1625]  Enc Vitals Group     BP 119/77     Pulse Rate 96     Resp 18     Temp 99.2 F (37.3 C)     Temp Source Oral     SpO2 100 %     Weight 135 lb (61.2 kg)     Height 5\' 5"  (1.651 m)     Head Circumference      Peak Flow      Pain Score 3     Pain Loc      Pain Edu?      Excl. in [place]?    Vitals:   04/02/21 1625  BP: 119/77  Pulse: 96  Resp: 18  Temp: 99.2 F (37.3 C)  SpO2: 100%   Physical Exam Vitals and nursing note reviewed.  Constitutional:      General: She is not in acute distress.    Appearance: She is well-developed.  HENT:     Head: Normocephalic.     Right Ear: External ear normal.     Left Ear: External ear normal.     Nose: Nose normal.  Eyes:     Conjunctiva/sclera: Conjunctivae normal.  Cardiovascular:     Rate and Rhythm: Normal rate and regular rhythm.     Heart sounds: No murmur heard. Pulmonary:     Effort: Pulmonary effort is normal. No respiratory distress.     Breath sounds: Normal breath sounds.  Abdominal:     Palpations: Abdomen is soft.     Tenderness: There is no abdominal tenderness.  Musculoskeletal:        General: No swelling.     Cervical back: Neck supple.  Skin:    General: Skin is warm and dry.     Capillary Refill: Capillary refill takes less than 2 seconds.  Neurological:     Mental Status: She is alert and oriented to person, place, and time.  Psychiatric:        Mood and Affect: Mood normal.     CN II-XII grossly intact.  Some mild tenderness over the erythematous nasal arch without any deformity or septal hematoma.  There is little erythema over the left maxilla but no tenderness.  Oropharynx is unremarkable.  No tenderness/deformities over the C/T/L spine  No focal TTP over b/l shoulders, elbows, wrists, hips, knees, ankles  2+ b/l radial and  PD pulses   No other obvious trauma to face, scalp ,head, neck or torso  Accompanied by chaperone for pelvic exam.  Patient has some erythema and friability at her closed cervical os without any active bleeding or other abnormalities noted.  ____________________________________________   LABS (all labs ordered are listed, but only abnormal results are displayed)  Labs Reviewed  BASIC METABOLIC PANEL - Abnormal; Notable for the following components:      Result Value   Calcium 8.4 (*)    All other components within normal limits  CBC - Abnormal; Notable for the following components:   RBC 5.30 (*)    Hemoglobin 16.3 (*)    HCT 48.0 (*)    All other components within normal limits  URINALYSIS, ROUTINE W REFLEX MICROSCOPIC - Abnormal; Notable for the following components:   Specific Gravity, Urine >1.030 (*)    Hgb urine dipstick MODERATE (*)    Bilirubin Urine SMALL (*)    Ketones, ur 80 (*)    Protein, ur 30 (*)    Leukocytes,Ua TRACE (*)    All other components within normal limits  WET PREP, GENITAL  CHLAMYDIA/NGC RT PCR ([facility] ONLY)            URINALYSIS, MICROSCOPIC (REFLEX)  POC URINE PREG, ED   ____________________________________________  EKG  ECG shows sinus tachycardia with a ventricular rate of 123, axis deviation, otherwise unremarkable intervals no evidence of ischemia or significant arrhythmia. ____________________________________________  RADIOLOGY  ED MD interpretation: Official radiology report(s): No results found.  ____________________________________________   PROCEDURES  Procedure(s) performed (including Critical Care):  Procedures   ____________________________________________   INITIAL IMPRESSION / ASSESSMENT AND PLAN / ED COURSE      Patient presents with above-stated history exam for assessment after a syncopal episode that occurred earlier today.  This is in the setting of about 1 week of some light vaginal spotting and discharge.   Other than soreness around her nose she is denying any other symptoms on arrival.  She is afebrile hemodynamically stable on arrival with a heart rate of 96 on my assessment.  With regard to possible fall and trauma events earlier today I suspect patient sustained a nasal contusion.  Cannot completely rule out nondisplaced small nasal arch fracture although given absence of any deformity or hematoma or other associated head neck pain I do not believe imaging of the head or neck is indicated at this time.  She is otherwise neurovascularly intact and there is no evidence of a CVA or ongoing or preceding headache to suggest venous sinus thrombosis.  Patient does remember feeling a little bit nauseous for hand and while she did not immediately stand up before passing out suspect vasovagal versus possible orthostatic syncope in the setting of plasma donation earlier in the day.  No history or clear features to suggest a seizure and it seems patient was not postictal.  ECG initially remarkable for some tachycardia seems to have resolved by the time I am assessing the patient.  CBC shows no leukocytosis or significant anemia with a hemoglobin of 16.3.  BMP without significant electrolyte or metabolic derangements.  UA has some trace leukocyte esterase ketones, protein, moderate hemoglobin and small bilirubin but otherwise unremarkable.  Given patient reported some bleeding discharge date of perform a pelvic exam concerning for some cervicitis.  She is denying any abdominal pain is no lower abdominal tenderness back pain fevers or leukocytosis and I have a low suspicion for PID or TOA at this time.  Pregnancy test is negative.  We will treat with IM Rocephin and a course of doxycycline.  Gonorrhea and committee studies sent as well as wet prep.  Wet prep is negative.  Given stable vitals otherwise reassuring exam and work-up with low suspicion for other immediate life-threatening pathology I think patient is  stable for discharge with  close outpatient follow-up.  Rx written for doxycycline.  Discharged in stable condition.  Strict return precautions advised and discussed.            ____________________________________________   FINAL CLINICAL IMPRESSION(S) / ED DIAGNOSES  Final diagnoses:  Syncope, unspecified syncope type  Cervicitis    Medications  cefTRIAXone (ROCEPHIN) injection 500 mg (500 mg Intramuscular Given 04/02/21 2007)     ED Discharge Orders          Ordered    doxycycline (VIBRAMYCIN) 100 MG capsule  2 times daily        04/02/21 1954             Note:  This document was prepared using Dragon voice recognition software and may include unintentional dictation errors.    Lucrezia Starch, MD 04/02/21 2015

## 2021-04-11 NOTE — Progress Notes (Deleted)
PCP:  Pcp, No   No chief complaint on file.    HPI:      Marilyn Lee is a 20 y.o. No obstetric history on file. whose LMP was Patient's last menstrual period was 04/02/2021., presents today for her annual examination.  Her menses are {norm/abn:715}, lasting {number: 22536} days.  Dysmenorrhea {dysmen:716}. She {does:18564} have intermenstrual bleeding.  Sex activity: {sex active: 315163}.  Last Pap: {WYOV:[id number]}  Results were: {norm/abn:16707::"no abnormalities"} /neg HPV DNA *** Hx of STDs: {STD hx:14358}  There is no FH of breast cancer. There is no FH of ovarian cancer. The patient {does:18564} do self-breast exams.  Tobacco use: {tob:20664} Alcohol use: {Alcohol:11675} No drug use.  Exercise: {exercise:31265}  She {does:18564} get adequate calcium and Vitamin D in her diet. Gardasil completed.   Patient Active Problem List   Diagnosis Date Noted   Abdominal pain 05/03/2020   Mononucleosis    Back pain 04/30/2017   Raynaud's phenomenon 02/21/2017    Past Surgical History:  Procedure Laterality Date   NASAL SEPTUM SURGERY     WISDOM TOOTH EXTRACTION     WISDOM TOOTH EXTRACTION      Family History  Problem Relation Age of Onset   Hypertension Father    Lung cancer Maternal Grandmother    Hypertension Maternal Grandmother    Hypertension Mother    Asthma Sister    Allergies Sister    Heart disease Paternal Grandmother    Pneumonia Paternal Grandmother    Breast cancer Neg Hx    Ovarian cancer Neg Hx     Social History   Socioeconomic History   Marital status: Single    Spouse name: Not on file   Number of children: Not on file   Years of education: Not on file   Highest education level: Not on file  Occupational History   Not on file  Tobacco Use   Smoking status: Never   Smokeless tobacco: Never  Vaping Use   Vaping Use: Never used  Substance and Sexual Activity   Alcohol use: Never   Drug use: Never   Sexual activity: Yes     Birth control/protection: Condom  Other Topics Concern   Not on file  Social History Narrative   ** Merged History Encounter **       Social Determinants of Health   Financial Resource Strain: Not on file  Food Insecurity: Not on file  Transportation Needs: Not on file  Physical Activity: Not on file  Stress: Not on file  Social Connections: Not on file  Intimate Partner Violence: Not on file     Current Outpatient Medications:    doxycycline (VIBRAMYCIN) 100 MG capsule, Take 1 capsule (100 mg total) by mouth 2 (two) times daily for 14 days., Disp: 28 capsule, Rfl: 0   meloxicam (MOBIC) 7.5 MG tablet, Take 7.5 mg by mouth daily. (Patient not taking: Reported on 01/04/2021), Disp: , Rfl:    Norethindrone Acetate-Ethinyl Estrad-FE (MICROGESTIN 24 FE) 1-20 MG-MCG(24) tablet, Take 1 tablet by mouth daily., Disp: 84 tablet, Rfl: 0     ROS:  Review of Systems BREAST: No symptoms   Objective: LMP 04/02/2021    OBGyn Exam  Results: No results found for this or any previous visit (from the past 24 hour(s)).  Assessment/Plan: No diagnosis found.  No orders of the defined types were placed in this encounter.            GYN counsel {counseling: 16159}  F/U  No follow-ups on file.  Jaycie Kregel B. Orian Figueira, PA-C 04/11/2021 4:35 PM

## 2021-04-16 ENCOUNTER — Ambulatory Visit: Payer: 59 | Admitting: Obstetrics and Gynecology

## 2021-04-30 NOTE — Progress Notes (Deleted)
° °  PCP:  Pcp, No   No chief complaint on file.    HPI:      [name] is a 21 y.o. No obstetric history on file. whose LMP was Patient's last menstrual period was 04/02/2021., presents today for her annual examination.  Her menses are regular every 28-30 days, lasting 5 days.  Dysmenorrhea {dysmen:716}. She {does:18564} have intermenstrual bleeding.  Sex activity: single partner, contraception - {contraception:315051}.  TEAR WIT HBLEEDING 9/21? Last Pap: N/A due to age Hx of STDs: {STD hx:14358}  There is no FH of breast cancer. There is no FH of ovarian cancer. The patient {does:18564} do self-breast exams.  Tobacco use: {tob:20664} Alcohol use: {Alcohol:11675} No drug use.  Exercise: {exercise:31265}  She {does:18564} get adequate calcium and Vitamin D in her diet. Gardasil completed.   Patient Active Problem List   Diagnosis Date Noted   Abdominal pain 05/03/2020   Mononucleosis    Back pain 04/30/2017   Raynaud's phenomenon 02/21/2017    Past Surgical History:  Procedure Laterality Date   NASAL SEPTUM SURGERY     WISDOM TOOTH EXTRACTION     WISDOM TOOTH EXTRACTION      Family History  Problem Relation Age of Onset   Hypertension Father    Lung cancer Maternal Grandmother    Hypertension Maternal Grandmother    Hypertension Mother    Asthma Sister    Allergies Sister    Heart disease Paternal Grandmother    Pneumonia Paternal Grandmother    Breast cancer Neg Hx    Ovarian cancer Neg Hx     Social History   Socioeconomic History   Marital status: Single    Spouse name: Not on file   Number of children: Not on file   Years of education: Not on file   Highest education level: Not on file  Occupational History   Not on file  Tobacco Use   Smoking status: Never   Smokeless tobacco: Never  Vaping Use   Vaping Use: Never used  Substance and Sexual Activity   Alcohol use: Never   Drug use: Never   Sexual activity: Yes    Birth  control/protection: Condom  Other Topics Concern   Not on file  Social History Narrative   ** Merged History Encounter **       Social Determinants of Health   Financial Resource Strain: Not on file  Food Insecurity: Not on file  Transportation Needs: Not on file  Physical Activity: Not on file  Stress: Not on file  Social Connections: Not on file  Intimate Partner Violence: Not on file     Current Outpatient Medications:    meloxicam (MOBIC) 7.5 MG tablet, Take 7.5 mg by mouth daily. (Patient not taking: Reported on 01/04/2021), Disp: , Rfl:    Norethindrone Acetate-Ethinyl Estrad-FE (MICROGESTIN 24 FE) 1-20 MG-MCG(24) tablet, Take 1 tablet by mouth daily., Disp: 84 tablet, Rfl: 0     ROS:  Review of Systems BREAST: No symptoms   Objective: LMP 04/02/2021    OBGyn Exam  Results: No results found for this or any previous visit (from the past 24 hour(s)).  Assessment/Plan: No diagnosis found.  No orders of the defined types were placed in this encounter.            GYN counsel {counseling: 16159}     F/U  No follow-ups on file.  Henritta Mutz B. Nikodem Leadbetter, PA-C 04/30/2021 4:18 PM

## 2021-05-01 ENCOUNTER — Ambulatory Visit: Payer: 59 | Admitting: Obstetrics and Gynecology

## 2021-05-01 DIAGNOSIS — Z01419 Encounter for gynecological examination (general) (routine) without abnormal findings: Secondary | ICD-10-CM

## 2021-05-01 DIAGNOSIS — Z113 Encounter for screening for infections with a predominantly sexual mode of transmission: Secondary | ICD-10-CM

## 2021-05-02 ENCOUNTER — Other Ambulatory Visit: Payer: Self-pay

## 2021-05-02 DIAGNOSIS — Z30011 Encounter for initial prescription of contraceptive pills: Secondary | ICD-10-CM

## 2021-05-02 MED ORDER — MICROGESTIN 24 FE 1-20 MG-MCG PO TABS: 1.0000 | ORAL_TABLET | Freq: Every day | ORAL | 0 refills | Status: DC

## 2021-05-02 NOTE — Telephone Encounter (Signed)
Pt calling; has scheduled an appt for 05/29/21; needs bc refill.  [phone]  Pt aware refill eRx'd. To [facility] [address].

## 2021-05-28 NOTE — Progress Notes (Signed)
PCP:  Pcp, No   Chief Complaint  Patient presents with   Gynecologic Exam    No concerns     HPI:      [name] is a 21 y.o. No obstetric history on file. whose LMP was Patient's last menstrual period was 05/03/2021 (approximate)., presents today for her annual examination.  Her menses are regular every 28-30 days, lasting 3-4 days on OCPs.  Dysmenorrhea mild, occurring first 1-2 days of flow. She usually doesn't have intermenstrual bleeding but has had some past few pills packs. 1 time with late pills, this month no late/missed pills.   Sex activity: single partner, contraception - OCP (estrogen/progesterone).  Considering nexplanon. Has tearing at post fourchette even if uses lubricants. Is doing different positions and has cut down on frequency of sex to only once daily.  Last Pap: N/A due to age Hx of STDs: none  There is no FH of breast cancer. There is no FH of ovarian cancer. The patient does do self-breast exams.  Tobacco use: The patient denies current or previous tobacco use. Alcohol use: social drinker No drug use.  Exercise: moderately active  She does get adequate calcium but not Vitamin D in her diet. Gardasil completed.   Has had issues with syncope and feeling lightheaded past few months. Went to ED 12/22 with syncope with neg labs. Pt trying to stay hydrated and eating protein with foods.  Also very fatigued, but only getting about 4-5 hrs sleep nightly.   Patient Active Problem List   Diagnosis Date Noted   Abdominal pain 05/03/2020   Mononucleosis    Back pain 04/30/2017   Raynaud's phenomenon 02/21/2017    Past Surgical History:  Procedure Laterality Date   NASAL SEPTUM SURGERY     WISDOM TOOTH EXTRACTION     WISDOM TOOTH EXTRACTION      Family History  Problem Relation Age of Onset   Hypertension Father    Lung cancer Maternal Grandmother    Hypertension Maternal Grandmother    Hypertension Mother    Asthma Sister    Allergies  Sister    Heart disease Paternal Grandmother    Pneumonia Paternal Grandmother    Breast cancer Neg Hx    Ovarian cancer Neg Hx     Social History   Socioeconomic History   Marital status: Single    Spouse name: Not on file   Number of children: Not on file   Years of education: Not on file   Highest education level: Not on file  Occupational History   Not on file  Tobacco Use   Smoking status: Never   Smokeless tobacco: Never  Vaping Use   Vaping Use: Never used  Substance and Sexual Activity   Alcohol use: Never   Drug use: Never   Sexual activity: Yes    Birth control/protection: Pill  Other Topics Concern   Not on file  Social History Narrative   ** Merged History Encounter **       Social Determinants of Health   Financial Resource Strain: Not on file  Food Insecurity: Not on file  Transportation Needs: Not on file  Physical Activity: Not on file  Stress: Not on file  Social Connections: Not on file  Intimate Partner Violence: Not on file     Current Outpatient Medications:    Norethindrone Acetate-Ethinyl Estrad-FE (MICROGESTIN 24 FE) 1-20 MG-MCG(24) tablet, Take 1 tablet by mouth daily., Disp: 84 tablet, Rfl: 3  ROS:  Review of Systems  Constitutional:  Negative for fatigue, fever and unexpected weight change.  Respiratory:  Negative for cough, shortness of breath and wheezing.   Cardiovascular:  Negative for chest pain, palpitations and leg swelling.  Gastrointestinal:  Negative for blood in stool, constipation, diarrhea, nausea and vomiting.  Endocrine: Negative for cold intolerance, heat intolerance and polyuria.  Genitourinary:  Positive for vaginal bleeding. Negative for dyspareunia, dysuria, flank pain, frequency, genital sores, hematuria, menstrual problem, pelvic pain, urgency, vaginal discharge and vaginal pain.  Musculoskeletal:  Negative for back pain, joint swelling and myalgias.  Skin:  Negative for rash.  Neurological:  Negative  for dizziness, syncope, light-headedness, numbness and headaches.  Hematological:  Negative for adenopathy.  Psychiatric/Behavioral:  Negative for agitation, confusion, sleep disturbance and suicidal ideas. The patient is not nervous/anxious.   BREAST: No symptoms   Objective: BP 100/70    Ht 5\' 5"  (1.651 m)    Wt 137 lb (62.1 kg)    LMP 05/03/2021 (Approximate)    BMI 22.80 kg/m    Physical Exam Constitutional:      Appearance: She is well-developed.  Genitourinary:     Vulva normal.     Right Labia: No rash, tenderness or lesions.    Left Labia: No tenderness, lesions or rash.       No vaginal discharge, erythema or tenderness.      Right Adnexa: not tender and no mass present.    Left Adnexa: not tender and no mass present.    No cervical friability or polyp.     Uterus is not enlarged or tender.  Breasts:    Right: No mass, nipple discharge, skin change or tenderness.     Left: No mass, nipple discharge, skin change or tenderness.  Neck:     Thyroid: No thyromegaly.  Cardiovascular:     Rate and Rhythm: Normal rate and regular rhythm.     Heart sounds: Normal heart sounds. No murmur heard. Pulmonary:     Effort: Pulmonary effort is normal.     Breath sounds: Normal breath sounds.  Abdominal:     Palpations: Abdomen is soft.     Tenderness: There is no abdominal tenderness. There is no guarding or rebound.  Musculoskeletal:        General: Normal range of motion.     Cervical back: Normal range of motion.  Lymphadenopathy:     Cervical: No cervical adenopathy.  Neurological:     General: No focal deficit present.     Mental Status: She is alert and oriented to person, place, and time.     Cranial Nerves: No cranial nerve deficit.  Skin:    General: Skin is warm and dry.  Psychiatric:        Mood and Affect: Mood normal.        Behavior: Behavior normal.        Thought Content: Thought content normal.        Judgment: Judgment normal.  Vitals reviewed.     Assessment/Plan: Encounter for annual routine gynecological examination  Screening for STD (sexually transmitted disease) - Plan: Cervicovaginal ancillary only  Encounter for surveillance of contraceptive pills - Plan: Norethindrone Acetate-Ethinyl Estrad-FE (MICROGESTIN 24 FE) 1-20 MG-MCG(24) tablet; OCP RF. Also discussed nexplanon and IUD. Pt to f/u with menses for insertion prn.   Breakthrough bleeding on OCPs - Plan: Cervicovaginal ancillary only; rule out STDs. If neg, see if sx persist and can change OCPs prn.   Meds ordered  this encounter  Medications   Norethindrone Acetate-Ethinyl Estrad-FE (MICROGESTIN 24 FE) 1-20 MG-MCG(24) tablet    Sig: Take 1 tablet by mouth daily.    Dispense:  84 tablet    Refill:  3    Order Specific Question:   Supervising Provider    Answer:   Gae Dry [[id number]]             GYN counsel adequate intake of calcium and vitamin D, diet and exercise     F/U  Return in about 1 year (around 05/29/2022).  Zhanna Melin B. Miyana Mordecai, PA-C 05/29/2021 10:45 AM

## 2021-05-29 ENCOUNTER — Other Ambulatory Visit (HOSPITAL_COMMUNITY)
Admission: RE | Admit: 2021-05-29 | Discharge: 2021-05-29 | Disposition: A | Discharge status: Home / Self Care | Payer: 59 | Source: Ambulatory Visit | Attending: Obstetrics and Gynecology | Admitting: Obstetrics and Gynecology

## 2021-05-29 ENCOUNTER — Other Ambulatory Visit: Payer: Self-pay

## 2021-05-29 ENCOUNTER — Ambulatory Visit (INDEPENDENT_AMBULATORY_CARE_PROVIDER_SITE_OTHER): Payer: 59 | Admitting: Obstetrics and Gynecology

## 2021-05-29 ENCOUNTER — Encounter: Payer: Self-pay | Admitting: Obstetrics and Gynecology

## 2021-05-29 VITALS — BP 100/70 | Ht 65.0 in | Wt 137.0 lb

## 2021-05-29 DIAGNOSIS — N921 Excessive and frequent menstruation with irregular cycle: Secondary | ICD-10-CM

## 2021-05-29 DIAGNOSIS — Z01419 Encounter for gynecological examination (general) (routine) without abnormal findings: Secondary | ICD-10-CM | POA: Diagnosis not present

## 2021-05-29 DIAGNOSIS — Z3041 Encounter for surveillance of contraceptive pills: Secondary | ICD-10-CM

## 2021-05-29 DIAGNOSIS — Z113 Encounter for screening for infections with a predominantly sexual mode of transmission: Secondary | ICD-10-CM | POA: Diagnosis not present

## 2021-05-29 MED ORDER — MICROGESTIN 24 FE 1-20 MG-MCG PO TABS: 1.0000 | ORAL_TABLET | Freq: Every day | ORAL | 3 refills | Status: DC

## 2021-05-30 LAB — CERVICOVAGINAL ANCILLARY ONLY
Chlamydia: NEGATIVE
Comment: NEGATIVE
Comment: NORMAL
Neisseria Gonorrhea: NEGATIVE

## 2021-06-12 ENCOUNTER — Ambulatory Visit
Admission: RE | Admit: 2021-06-12 | Discharge: 2021-06-12 | Disposition: A | Discharge status: Home / Self Care | Payer: 59 | Source: Ambulatory Visit | Attending: Urgent Care | Admitting: Urgent Care

## 2021-06-12 ENCOUNTER — Other Ambulatory Visit: Payer: Self-pay

## 2021-06-12 VITALS — BP 102/69 | HR 76 | Temp 98.5°F | Resp 16

## 2021-06-12 DIAGNOSIS — M79672 Pain in left foot: Secondary | ICD-10-CM | POA: Diagnosis not present

## 2021-06-12 DIAGNOSIS — R238 Other skin changes: Secondary | ICD-10-CM | POA: Diagnosis not present

## 2021-06-12 DIAGNOSIS — L089 Local infection of the skin and subcutaneous tissue, unspecified: Secondary | ICD-10-CM | POA: Diagnosis not present

## 2021-06-12 DIAGNOSIS — M79671 Pain in right foot: Secondary | ICD-10-CM | POA: Diagnosis not present

## 2021-06-12 MED ORDER — CEPHALEXIN 500 MG PO CAPS: 500.0000 mg | ORAL_CAPSULE | Freq: Three times a day (TID) | ORAL | 0 refills | Status: DC

## 2021-06-12 NOTE — Discharge Instructions (Signed)
Use warm soapy soaks with Dial antibacterial or Dove for gentle skin.  Try this for 5 minutes 3-5 times daily to keep your feet clean and promote healing.  Make sure you pat your wounds dry very well.  Then place a nonstick dressing over the area in question and then secure with Coban.

## 2021-06-12 NOTE — ED Provider Notes (Signed)
Marilyn Lee   MRN: [id number] DOB: 07-31-2000  Subjective:   [employer] Marilyn Lee is a 21 y.o. female presenting for 1 day history of acute onset worsening bilateral foot pain.  Patient developed multiple blisters from the wearing high heels that she did not break in.  States that the pain is worsening and has had some swelling that has progressed with white pockets.  Denies fever, spontaneous drainage.  She has been using Neosporin, Vaseline.  Is having worsening difficulty walking because of pain.  No current facility-administered medications for this encounter.  Current Outpatient Medications:    Norethindrone Acetate-Ethinyl Estrad-FE (MICROGESTIN 24 FE) 1-20 MG-MCG(24) tablet, Take 1 tablet by mouth daily., Disp: 84 tablet, Rfl: 3   No Known Allergies  Past Medical History:  Diagnosis Date   Mononucleosis    Vaccine for human papilloma virus (HPV) types 6, 11, 16, and 18 administered      Past Surgical History:  Procedure Laterality Date   NASAL SEPTUM SURGERY     WISDOM TOOTH EXTRACTION     WISDOM TOOTH EXTRACTION      Family History  Problem Relation Age of Onset   Hypertension Father    Lung cancer Maternal Grandmother    Hypertension Maternal Grandmother    Hypertension Mother    Asthma Sister    Allergies Sister    Heart disease Paternal Grandmother    Pneumonia Paternal Grandmother    Breast cancer Neg Hx    Ovarian cancer Neg Hx     Social History   Tobacco Use   Smoking status: Never   Smokeless tobacco: Never  Vaping Use   Vaping Use: Never used  Substance Use Topics   Alcohol use: Never   Drug use: Never    ROS   Objective:   Vitals: BP 102/69 (BP Location: Left Arm)    Pulse 76    Temp 98.5 F (36.9 C) (Oral)    Resp 16    LMP 05/29/2021 (Approximate)    SpO2 98%   Physical Exam Constitutional:      General: She is not in acute distress.    Appearance: Normal appearance. She is well-developed. She is not ill-appearing,  toxic-appearing or diaphoretic.  HENT:     Head: Normocephalic and atraumatic.     Nose: Nose normal.     Mouth/Throat:     Mouth: Mucous membranes are moist.  Eyes:     General: No scleral icterus.       Right eye: No discharge.        Left eye: No discharge.     Extraocular Movements: Extraocular movements intact.  Cardiovascular:     Rate and Rhythm: Normal rate.  Pulmonary:     Effort: Pulmonary effort is normal.  Skin:    General: Skin is warm and dry.  Neurological:     General: No focal deficit present.     Mental Status: She is alert and oriented to person, place, and time.  Psychiatric:        Mood and Affect: Mood normal.        Behavior: Behavior normal.             Assessment and Plan :   PDMP not reviewed this encounter.  1. Blisters of multiple sites, infected   2. Bilateral foot pain     Counseled on wound care.  Recommended Keflex for pain and inflammation.  Use warm soapy soaks.  Tylenol and/or ibuprofen for pain control. Counseled patient  on potential for adverse effects with medications prescribed/recommended today, ER and return-to-clinic precautions discussed, patient verbalized understanding.    Marilyn Lee, [place] 06/12/21 [phone]

## 2021-06-12 NOTE — ED Triage Notes (Signed)
Pt presents with blisters on bilateral feet since yesterday

## 2021-06-18 ENCOUNTER — Encounter: Payer: Self-pay | Admitting: Emergency Medicine

## 2021-06-18 ENCOUNTER — Other Ambulatory Visit: Payer: Self-pay

## 2021-06-18 ENCOUNTER — Ambulatory Visit
Admission: EM | Admit: 2021-06-18 | Discharge: 2021-06-18 | Disposition: A | Discharge status: Home / Self Care | Payer: 59 | Attending: Emergency Medicine | Admitting: Emergency Medicine

## 2021-06-18 ENCOUNTER — Inpatient Hospital Stay
Admission: RE | Admit: 2021-06-18 | Discharge: 2021-06-18 | Disposition: A | Discharge status: Home / Self Care | Payer: Self-pay | Source: Ambulatory Visit

## 2021-06-18 DIAGNOSIS — T148XXA Other injury of unspecified body region, initial encounter: Secondary | ICD-10-CM | POA: Diagnosis not present

## 2021-06-18 MED ORDER — IBUPROFEN 600 MG PO TABS: 600.0000 mg | ORAL_TABLET | Freq: Four times a day (QID) | ORAL | 0 refills | Status: DC | PRN

## 2021-06-18 NOTE — ED Triage Notes (Signed)
Pt was seen 2/22 for blisters on her feet. She's here today to make sure they're healing properly.

## 2021-06-18 NOTE — Discharge Instructions (Addendum)
We will contact you if your wound culture comes back positive for an infection requiring antibiotics.  In the meantime, keep it clean with soap and water and continue wound care with nonstick dressings.  Take 600 mg of ibuprofen, 1000 mg of Tylenol together 3-4 times a day as needed for pain.  you can also try moleskin pads around the blister to help protect it while wearing shoes.

## 2021-06-18 NOTE — ED Provider Notes (Signed)
HPI  SUBJECTIVE:  Marilyn Lee is a 21 y.o. female who presents with worsening throbbing pain and fluid reaccumulation in the blister on her left first MTP.  No surrounding erythema, swelling.  No fevers. She finishes the Keflex tomorrow and has been doing ibuprofen and wound care.  The ibuprofen helps.  Symptoms are worse with standing, walking, wearing shoes, and in the morning.  Patient was seen here on 2/22 for multiple blisters after wearing too tight shoes, they were drained, and she  was sent home on Keflex. Patient has no past medical history.  No history of  MRSA.  LMP: 2 weeks ago.  Denies possibility being pregnant.  PCP: Cannot remember.  Past Medical History:  Diagnosis Date   Mononucleosis    Vaccine for human papilloma virus (HPV) types 6, 11, 16, and 18 administered     Past Surgical History:  Procedure Laterality Date   NASAL SEPTUM SURGERY     WISDOM TOOTH EXTRACTION     WISDOM TOOTH EXTRACTION      Family History  Problem Relation Age of Onset   Hypertension Father    Lung cancer Maternal Grandmother    Hypertension Maternal Grandmother    Hypertension Mother    Asthma Sister    Allergies Sister    Heart disease Paternal Grandmother    Pneumonia Paternal Grandmother    Breast cancer Neg Hx    Ovarian cancer Neg Hx     Social History   Tobacco Use   Smoking status: Never   Smokeless tobacco: Never  Vaping Use   Vaping Use: Never used  Substance Use Topics   Alcohol use: Never   Drug use: Never    No current facility-administered medications for this encounter.  Current Outpatient Medications:    ibuprofen (ADVIL) 600 MG tablet, Take 1 tablet (600 mg total) by mouth every 6 (six) hours as needed., Disp: 30 tablet, Rfl: 0   cephALEXin (KEFLEX) 500 MG capsule, Take 1 capsule (500 mg total) by mouth 3 (three) times daily., Disp: 21 capsule, Rfl: 0   Norethindrone Acetate-Ethinyl Estrad-FE (MICROGESTIN 24 FE) 1-20 MG-MCG(24) tablet, Take 1 tablet by  mouth daily., Disp: 84 tablet, Rfl: 3  No Known Allergies   ROS  As noted in HPI.   Physical Exam  BP 119/78 (BP Location: Left Arm)    Pulse 68    Temp 98.7 F (37.1 C) (Oral)    Resp 16    LMP 05/29/2021 (Approximate)    SpO2 98%    Constitutional: Well developed, well nourished, no acute distress Eyes:  EOMI, conjunctiva normal bilaterally HENT: Normocephalic, atraumatic,mucus membranes moist Respiratory: Normal inspiratory effort Cardiovascular: Normal rate GI: nondistended skin: Tender large blister first MTP with an area of white.  Skin appears to be mostly readhered.  No surrounding erythema, induration    Musculoskeletal: no deformities Neurologic: Alert & oriented x 3, no focal neuro deficits Psychiatric: Speech and behavior appropriate   ED Course   Medications - No data to display  Orders Placed This Encounter  Procedures   Aerobic Culture w Gram Stain (superficial specimen)    Standing Status:   Standing    Number of Occurrences:   1    No results found for this or any previous visit (from the past 24 hour(s)). No results found.  ED Clinical Impression  1. Blister      ED Assessment/Plan  Previous records reviewed.   Ulcer appears to be healing, however, concern for tender  white area possibly containing pus.   will do a needle I&D.  Procedure note: Cleaned area with alcohol.  Using a sterile 18-gauge needle and made a single puncture wound.  Skin is loose and not adhered in this area.  Expressed a small amount of serosanguineous drainage.  Sending cultures.  Applied Band-Aids.  Patient tolerated procedure well  No expressible purulent drainage with the I&D.  I suspect that the white skin is simply skin that is not readhered yet.  It otherwise appears to be healing well.  Will send home with Tylenol/ibuprofen, work note for 2 days, continue wound care.  Moleskin pads.  Do not think that she needs further antibiotics as there is no evidence of  cellulitis.  Discussed MDM, treatment plan, and plan for follow-up with patient. patient agrees with plan.   Meds ordered this encounter  Medications   ibuprofen (ADVIL) 600 MG tablet    Sig: Take 1 tablet (600 mg total) by mouth every 6 (six) hours as needed.    Dispense:  30 tablet    Refill:  0      *This clinic note was created using [employer]. Therefore, there may be occasional mistakes despite careful proofreading.  ?    Melynda Ripple, MD 06/18/21 [phone]

## 2021-06-21 LAB — AEROBIC CULTURE W GRAM STAIN (SUPERFICIAL SPECIMEN)
Culture: NO GROWTH
Gram Stain: NONE SEEN

## 2021-07-22 ENCOUNTER — Ambulatory Visit: Payer: 59

## 2021-07-22 ENCOUNTER — Ambulatory Visit (HOSPITAL_COMMUNITY)
Admission: RE | Admit: 2021-07-22 | Discharge: 2021-07-22 | Disposition: A | Discharge status: Home / Self Care | Payer: 59 | Source: Ambulatory Visit

## 2021-07-22 NOTE — ED Notes (Signed)
Pt left without being seen. She was able to get an appointment with her pcp. ?

## 2021-07-23 ENCOUNTER — Emergency Department
Admission: EM | Admit: 2021-07-23 | Discharge: 2021-07-23 | Disposition: A | Discharge status: Home / Self Care | Payer: 59 | Attending: Emergency Medicine | Admitting: Emergency Medicine

## 2021-07-23 ENCOUNTER — Emergency Department: Payer: 59

## 2021-07-23 ENCOUNTER — Other Ambulatory Visit: Payer: Self-pay

## 2021-07-23 DIAGNOSIS — R112 Nausea with vomiting, unspecified: Secondary | ICD-10-CM

## 2021-07-23 DIAGNOSIS — R197 Diarrhea, unspecified: Secondary | ICD-10-CM | POA: Diagnosis not present

## 2021-07-23 DIAGNOSIS — R1012 Left upper quadrant pain: Secondary | ICD-10-CM

## 2021-07-23 DIAGNOSIS — E876 Hypokalemia: Secondary | ICD-10-CM | POA: Insufficient documentation

## 2021-07-23 LAB — URINALYSIS, ROUTINE W REFLEX MICROSCOPIC
Bacteria, UA: NONE SEEN
Bilirubin Urine: NEGATIVE
Glucose, UA: NEGATIVE mg/dL
Ketones, ur: NEGATIVE mg/dL
Leukocytes,Ua: NEGATIVE
Nitrite: NEGATIVE
Protein, ur: NEGATIVE mg/dL
Specific Gravity, Urine: 1.006 (ref 1.005–1.030)
pH: 6 (ref 5.0–8.0)

## 2021-07-23 LAB — COMPREHENSIVE METABOLIC PANEL
ALT: 31 U/L (ref 0–44)
AST: 28 U/L (ref 15–41)
Albumin: 3.9 g/dL (ref 3.5–5.0)
Alkaline Phosphatase: 42 U/L (ref 38–126)
Anion gap: 9 (ref 5–15)
BUN: 5 mg/dL — ABNORMAL LOW (ref 6–20)
CO2: 22 mmol/L (ref 22–32)
Calcium: 8.7 mg/dL — ABNORMAL LOW (ref 8.9–10.3)
Chloride: 108 mmol/L (ref 98–111)
Creatinine, Ser: 0.73 mg/dL (ref 0.44–1.00)
GFR, Estimated: >60 mL/min (ref >60)
Glucose, Bld: 116 mg/dL — ABNORMAL HIGH (ref 70–99)
Potassium: 3.4 mmol/L — ABNORMAL LOW (ref 3.5–5.1)
Sodium: 139 mmol/L (ref 135–145)
Total Bilirubin: 0.9 mg/dL (ref 0.3–1.2)
Total Protein: 6.8 g/dL (ref 6.5–8.1)

## 2021-07-23 LAB — POC URINE PREG, ED: Preg Test, Ur: NEGATIVE

## 2021-07-23 LAB — CBC
HCT: 43 % (ref 36.0–46.0)
Hemoglobin: 14.9 g/dL (ref 12.0–15.0)
MCH: 30.3 pg (ref 26.0–34.0)
MCHC: 34.7 g/dL (ref 30.0–36.0)
MCV: 87.6 fL (ref 80.0–100.0)
Platelets: 260 10*3/uL (ref 150–400)
RBC: 4.91 MIL/uL (ref 3.87–5.11)
RDW: 12.1 % (ref 11.5–15.5)
WBC: 4.3 10*3/uL (ref 4.0–10.5)
nRBC: 0 % (ref 0.0–0.2)

## 2021-07-23 LAB — LIPASE, BLOOD: Lipase: 43 U/L (ref 11–51)

## 2021-07-23 MED ORDER — PROMETHAZINE HCL 12.5 MG PO TABS: 12.5000 mg | ORAL_TABLET | Freq: Four times a day (QID) | ORAL | 0 refills | Status: DC | PRN

## 2021-07-23 MED ORDER — LACTATED RINGERS IV BOLUS
1000.0000 mL | Freq: Once | INTRAVENOUS | Status: AC
  Administered 2021-07-23: 1000 mL via INTRAVENOUS

## 2021-07-23 MED ORDER — IOHEXOL 300 MG/ML  SOLN
80.0000 mL | Freq: Once | INTRAMUSCULAR | Status: AC | PRN
  Administered 2021-07-23: 80 mL via INTRAVENOUS

## 2021-07-23 MED ORDER — KETOROLAC TROMETHAMINE 30 MG/ML IJ SOLN
15.0000 mg | Freq: Once | INTRAMUSCULAR | Status: AC
Start: 2021-07-23 — End: 2021-07-23
  Administered 2021-07-23: 15 mg via INTRAVENOUS
  Filled 2021-07-23: qty 1

## 2021-07-23 MED ORDER — POTASSIUM CHLORIDE CRYS ER 20 MEQ PO TBCR
20.0000 meq | EXTENDED_RELEASE_TABLET | Freq: Once | ORAL | Status: AC
  Administered 2021-07-23: 20 meq via ORAL
  Filled 2021-07-23: qty 1

## 2021-07-23 MED ORDER — ONDANSETRON HCL 4 MG/2ML IJ SOLN
4.0000 mg | Freq: Once | INTRAMUSCULAR | Status: AC
Start: 2021-07-23 — End: 2021-07-23
  Administered 2021-07-23: 4 mg via INTRAVENOUS
  Filled 2021-07-23: qty 2

## 2021-07-23 NOTE — ED Triage Notes (Signed)
Pt come with c/o upper left pain. Pt states this started this am. Pt denies any vomiting but states nausea.  Pt denies any urinary symptoms. Pt states diarrhea. ?

## 2021-07-23 NOTE — ED Provider Notes (Signed)
? ?[facility] ?Provider Note ? ? ? Event Date/Time  ? First MD Initiated Contact with Patient 07/23/21 [phone]   ?  (approximate) ? ? ?History  ? ?Chief Complaint ?Abdominal Pain ? ? ?HPI ? ?Marilyn Lee is a 21 y.o. female with no significant past medical history who presents to the ED complaining of abdominal pain.  Patient reports that she has had 4 to 5 days of persistent nausea, vomiting, and diarrhea.  She denies any blood in her stool and has not had any fevers, denies recent travel.  She was seen at urgent care yesterday and given IV fluids with otherwise reassuring work-up.  She states that overnight she developed severe pain in her abdomen diffusely, but greatest in the left upper quadrant.  She has continued to have nausea with vomiting and diarrhea.  She denies any dysuria or flank pain, states her LMP is currently ongoing.  She has never happened similar symptoms in the past, denies prior abdominal surgeries. ? ?  ? ? ?Physical Exam  ? ?Triage Vital Signs: ?ED Triage Vitals  ?Enc Vitals Group  ?   BP 07/23/21 0821 132/83  ?   Pulse Rate 07/23/21 0821 90  ?   Resp 07/23/21 0821 18  ?   Temp 07/23/21 0821 98 ?F (36.7 ?C)  ?   Temp src --   ?   SpO2 07/23/21 0821 97 %  ?   Weight --   ?   Height --   ?   Head Circumference --   ?   Peak Flow --   ?   Pain Score 07/23/21 0821 10  ?   Pain Loc --   ?   Pain Edu? --   ?   Excl. in [place]? --   ? ? ?Most recent vital signs: ?Vitals:  ? 07/23/21 1130 07/23/21 1230  ?BP: 112/71 113/69  ?Pulse: 70 69  ?Resp: 18 18  ?Temp:    ?SpO2: 100% 100%  ? ? ?Constitutional: Alert and oriented. ?Eyes: Conjunctivae are normal. ?Head: Atraumatic. ?Nose: No congestion/rhinnorhea. ?Mouth/Throat: Mucous membranes are moist.  ?Cardiovascular: Normal rate, regular rhythm. Grossly normal heart sounds.  2+ radial pulses bilaterally. ?Respiratory: Normal respiratory effort.  No retractions. Lungs CTAB. ?Gastrointestinal: Soft and tender to palpation diffusely,  greatest in the left upper quadrant. No distention. ?Musculoskeletal: No lower extremity tenderness nor edema.  ?Neurologic:  Normal speech and language. No gross focal neurologic deficits are appreciated. ? ? ? ?ED Results / Procedures / Treatments  ? ?Labs ?(all labs ordered are listed, but only abnormal results are displayed) ?Labs Reviewed  ?COMPREHENSIVE METABOLIC PANEL - Abnormal; Notable for the following components:  ?    Result Value  ? Potassium 3.4 (*)   ? Glucose, Bld 116 (*)   ? BUN 5 (*)   ? Calcium 8.7 (*)   ? All other components within normal limits  ?URINALYSIS, ROUTINE W REFLEX MICROSCOPIC - Abnormal; Notable for the following components:  ? Color, Urine YELLOW (*)   ? APPearance CLEAR (*)   ? Hgb urine dipstick MODERATE (*)   ? All other components within normal limits  ?LIPASE, BLOOD  ?CBC  ?POC URINE PREG, ED  ? ? ?RADIOLOGY ?CT of abdomen/pelvis reviewed by me with no dilated bowel loops, fluid collections, or inflammatory changes noted. ? ?PROCEDURES: ? ?Critical Care performed: No ? ?Procedures ? ? ?MEDICATIONS ORDERED IN ED: ?Medications  ?lactated ringers bolus 1,000 mL (1,000 mLs Intravenous New Bag/Given  07/23/21 1034)  ?ondansetron [facility]) injection 4 mg (4 mg Intravenous Given 07/23/21 0922)  ?ketorolac (TORADOL) 30 MG/ML injection 15 mg (15 mg Intravenous Given 07/23/21 0923)  ?iohexol (OMNIPAQUE) 300 MG/ML solution 80 mL (80 mLs Intravenous Contrast Given 07/23/21 0926)  ?potassium chloride SA (KLOR-CON M) CR tablet 20 mEq (20 mEq Oral Given 07/23/21 1213)  ? ? ? ?IMPRESSION / MDM / ASSESSMENT AND PLAN / ED COURSE  ?I reviewed the triage vital signs and the nursing notes. ?             ?               ? ?21 y.o. female with no significant past medical history presents to the ED complaining of 4 to 5 days of vomiting and diarrhea now associated with severe pain in her left upper quadrant developing overnight. ? ?Differential diagnosis includes, but is not limited to, gastroenteritis, AKI,  electrolyte abnormality, UTI, kidney stone, diverticulitis, appendicitis, gastritis, pregnancy. ? ?Patient uncomfortable appearing but in no acute distress, vital signs are unremarkable.  She does have significant tenderness on exam and while symptoms are consistent with gastroenteritis, pain appears out of proportion for this.  We will further assess with CT scan, treat symptomatically with IV Toradol and Zofran. ? ?CT scan is remarkable only for some free fluid noted in the pelvis, ruptured ovarian cyst could have contributed to patient's severe abdominal pain.  Pain now appears to have resolved and I have low suspicion for ovarian torsion.  Labs are reassuring with BMP remarkable only for mild hypokalemia, CBC without anemia or leukocytosis.  LFTs are within normal limits.  Patient is now tolerating water and crackers without difficulty, states that Zofran she was previously scribed was not helping her symptoms very much.  We will prescribe Phenergan and she was counseled to follow-up with her PCP, otherwise return to the ED for new or worsening symptoms.  Patient agrees with plan. ? ?  ? ? ?FINAL CLINICAL IMPRESSION(S) / ED DIAGNOSES  ? ?Final diagnoses:  ?Left upper quadrant abdominal pain  ?Nausea and vomiting, unspecified vomiting type  ? ? ? ?Rx / DC Orders  ? ?ED Discharge Orders   ? ?      Ordered  ?  promethazine (PHENERGAN) 12.5 MG tablet  Every 6 hours PRN       ? 07/23/21 1324  ? ?  ?  ? ?  ? ? ? ?Note:  This document was prepared using Dragon voice recognition software and may include unintentional dictation errors. ?  ?Blake Divine, MD ?07/23/21 1329 ? ?

## 2022-06-20 ENCOUNTER — Other Ambulatory Visit: Payer: Self-pay

## 2022-06-20 DIAGNOSIS — Z3041 Encounter for surveillance of contraceptive pills: Secondary | ICD-10-CM

## 2022-06-20 MED ORDER — MICROGESTIN 24 FE 1-20 MG-MCG PO TABS: 1.0000 | ORAL_TABLET | Freq: Every day | ORAL | 0 refills | Status: DC

## 2022-06-29 ENCOUNTER — Ambulatory Visit
Admission: RE | Admit: 2022-06-29 | Discharge: 2022-06-29 | Disposition: A | Discharge status: Home / Self Care | Payer: 59 | Source: Ambulatory Visit | Attending: Emergency Medicine | Admitting: Emergency Medicine

## 2022-06-29 VITALS — BP 107/82 | HR 72 | Temp 97.7°F | Resp 14 | Ht 65.0 in | Wt 135.0 lb

## 2022-06-29 DIAGNOSIS — T7840XA Allergy, unspecified, initial encounter: Secondary | ICD-10-CM | POA: Diagnosis not present

## 2022-06-29 MED ORDER — PREDNISONE 10 MG (21) PO TBPK: ORAL_TABLET | ORAL | 0 refills | Status: DC

## 2022-06-29 NOTE — Discharge Instructions (Signed)
Take over-the-counter Allegra 180 mg daily or Zyrtec or Claritin 10 mg daily to help with your itching.  You can take over-the-counter Benadryl, 50 mg at bedtime, as needed for itching and sleep.  Take the prednisone pack according to the package instructions.  You will taken on tapering dose over a period of 6 days.  Take it with food and always take it first in the morning with breakfast.  Take over-the-counter Pepcid 20 mg twice daily to help with itching as well.  If you develop any swelling of your lips or tongue, tightness in your throat, or difficulty breathing you need to go to the ER for evaluation. 

## 2022-06-29 NOTE — ED Provider Notes (Signed)
[facility]    CSN: [id number] Arrival date & time: 06/29/22  1405      History   Chief Complaint Chief Complaint  Patient presents with   Rash    Appointment    HPI Marilyn Lee is a 22 y.o. female.   HPI  22 year old female here for evaluation of skin rash.  The patient has no significant past medical history presenting for evaluation of an itchy rash that she developed on her arms, legs, abdomen, and back 2 to 3 days ago.  This is not associated with any swelling of the lips or tongue, tightness in throat, or shortness of breath.  She denies any new medications, supplements, foods, laundry detergent, or personal hygiene products.  Past Medical History:  Diagnosis Date   Mononucleosis    Vaccine for human papilloma virus (HPV) types 6, 11, 16, and 18 administered     Patient Active Problem List   Diagnosis Date Noted   Abdominal pain 05/03/2020   Mononucleosis    Back pain 04/30/2017   Raynaud's phenomenon 02/21/2017    Past Surgical History:  Procedure Laterality Date   NASAL SEPTUM SURGERY     WISDOM TOOTH EXTRACTION     WISDOM TOOTH EXTRACTION      OB History     Gravida  0   Para  0   Term  0   Preterm  0   AB  0   Living  0      SAB  0   IAB  0   Ectopic  0   Multiple  0   Live Births  0            Home Medications    Prior to Admission medications   Medication Sig Start Date End Date Taking? Authorizing Provider  Norethindrone Acetate-Ethinyl Estrad-FE (MICROGESTIN 24 FE) 1-20 MG-MCG(24) tablet Take 1 tablet by mouth daily. [id number]  Yes Copland, Deirdre Evener, PA-C  predniSONE (STERAPRED UNI-PAK 21 TAB) 10 MG (21) TBPK tablet Take 6 tablets on day 1, 5 tablets day 2, 4 tablets day 3, 3 tablets day 4, 2 tablets day 5, 1 tablet day 6 06/29/22  Yes Margarette Canada, NP  loperamide (IMODIUM) 2 MG capsule Take by mouth as needed for diarrhea or loose stools. 1-2 capsules as needed for onset of diarrhea    [provider]  promethazine (PHENERGAN) 12.5 MG tablet Take 1 tablet (12.5 mg total) by mouth every 6 (six) hours as needed for nausea or vomiting. 07/23/21   Blake Divine, MD    Family History Family History  Problem Relation Age of Onset   Hypertension Father    Lung cancer Maternal Grandmother    Hypertension Maternal Grandmother    Hypertension Mother    Asthma Sister    Allergies Sister    Heart disease Paternal Grandmother    Pneumonia Paternal Grandmother    Breast cancer Neg Hx    Ovarian cancer Neg Hx     Social History Social History   Tobacco Use   Smoking status: Never   Smokeless tobacco: Never  Vaping Use   Vaping Use: Never used  Substance Use Topics   Alcohol use: Never   Drug use: Never     Allergies   Patient has no known allergies.   Review of Systems Review of Systems  HENT:  Negative for facial swelling and trouble swallowing.   Respiratory:  Negative for shortness of breath, wheezing and stridor.  Skin:  Positive for rash.     Physical Exam Triage Vital Signs ED Triage Vitals  Enc Vitals Group     BP 06/29/22 1418 107/82     Pulse Rate 06/29/22 1418 72     Resp 06/29/22 1418 14     Temp 06/29/22 1418 97.7 F (36.5 C)     Temp Source 06/29/22 1418 Oral     SpO2 06/29/22 1418 100 %     Weight 06/29/22 1416 135 lb (61.2 kg)     Height 06/29/22 1416 '5\' 5"'$  (1.651 m)     Head Circumference --      Peak Flow --      Pain Score 06/29/22 1416 0     Pain Loc --      Pain Edu? --      Excl. in [place]? --    No data found.  Updated Vital Signs BP 107/82 (BP Location: Left Arm)   Pulse 72   Temp 97.7 F (36.5 C) (Oral)   Resp 14   Ht '5\' 5"'$  (1.651 m)   Wt 135 lb (61.2 kg)   LMP 06/27/2022 (Exact Date)   SpO2 100%   BMI 22.47 kg/m   Visual Acuity Right Eye Distance:   Left Eye Distance:   Bilateral Distance:    Right Eye Near:   Left Eye Near:    Bilateral Near:     Physical Exam Vitals and nursing note reviewed.  Constitutional:       Appearance: Normal appearance. She is not ill-appearing.  HENT:     Head: Normocephalic and atraumatic.  Cardiovascular:     Rate and Rhythm: Normal rate and regular rhythm.     Pulses: Normal pulses.     Heart sounds: Normal heart sounds. No murmur heard.    No friction rub. No gallop.  Pulmonary:     Effort: Pulmonary effort is normal.     Breath sounds: Normal breath sounds. No stridor. No wheezing, rhonchi or rales.  Skin:    General: Skin is warm and dry.     Capillary Refill: Capillary refill takes less than 2 seconds.     Findings: Rash present.  Neurological:     General: No focal deficit present.     Mental Status: She is alert and oriented to person, place, and time.  Psychiatric:        Mood and Affect: Mood normal.        Behavior: Behavior normal.        Thought Content: Thought content normal.        Judgment: Judgment normal.      UC Treatments / Results  Labs (all labs ordered are listed, but only abnormal results are displayed) Labs Reviewed - No data to display  EKG   Radiology No results found.  Procedures Procedures (including critical care time)  Medications Ordered in UC Medications - No data to display  Initial Impression / Assessment and Plan / UC Course  I have reviewed the triage vital signs and the nursing notes.  Pertinent labs & imaging results that were available during my care of the patient were reviewed by me and considered in my medical decision making (see chart for details).   Patient is a pleasant 22 year old female presenting for evaluation of an erythematous maculopapular rash that she developed on both arms, both legs, abdomen, and back 2 to 3 days ago.    She describes the rash is itchy.  The rash is raised  but nontender.  There is no hives anywhere on the patient's body.  Etiology of the rash is unclear given that she has not been outdoors, does not have pets, and has not changed anything in the way of personal  hygiene products, laundry detergents, medications, or supplements.  She has not taken anything for the rash.  I will treat her for potential allergic reaction with prednisone taper to start tomorrow morning at breakfast.  Also over-the-counter antihistamines.  If her rash does not resolve with prednisone, or returns after prednisone, I recommended that she follow-up with her primary care provider for a referral to an allergist for allergy testing.   Final Clinical Impressions(s) / UC Diagnoses   Final diagnoses:  Allergic reaction, initial encounter     Discharge Instructions      Take over-the-counter Allegra 180 mg daily or Zyrtec or Claritin 10 mg daily to help with your itching.  You can take over-the-counter Benadryl, 50 mg at bedtime, as needed for itching and sleep.  Take the prednisone pack according to the package instructions.  You will taken on tapering dose over a period of 6 days.  Take it with food and always take it first in the morning with breakfast.  Take over-the-counter Pepcid 20 mg twice daily to help with itching as well.  If you develop any swelling of your lips or tongue, tightness in your throat, or difficulty breathing you need to go to the ER for evaluation.      ED Prescriptions     Medication Sig Dispense Auth. Provider   predniSONE (STERAPRED UNI-PAK 21 TAB) 10 MG (21) TBPK tablet Take 6 tablets on day 1, 5 tablets day 2, 4 tablets day 3, 3 tablets day 4, 2 tablets day 5, 1 tablet day 6 21 tablet Margarette Canada, NP      PDMP not reviewed this encounter.   Margarette Canada, NP 06/29/22 1436

## 2022-06-29 NOTE — ED Triage Notes (Signed)
Patient c/o itchy rash all over her body that started 2-3 days ago.  Patient has not taken any medicine OTC for the rash.

## 2022-07-24 ENCOUNTER — Ambulatory Visit: Payer: 59 | Admitting: Obstetrics and Gynecology

## 2022-08-02 ENCOUNTER — Telehealth: Payer: 59 | Admitting: Nurse Practitioner

## 2022-08-02 DIAGNOSIS — J029 Acute pharyngitis, unspecified: Secondary | ICD-10-CM | POA: Diagnosis not present

## 2022-08-02 NOTE — Progress Notes (Signed)
  E-Visit for Sore Throat  We are sorry that you are not feeling well.  Here is how we plan to help!  Your symptoms indicate a likely viral infection (Pharyngitis).   Pharyngitis is inflammation in the back of the throat which can cause a sore throat, scratchiness and sometimes difficulty swallowing.   Pharyngitis is typically caused by a respiratory virus and will just run its course.  Please keep in mind that your symptoms could last up to 10 days.  For throat pain, we recommend over the counter oral pain relief medications such as acetaminophen or aspirin, or anti-inflammatory medications such as ibuprofen or naproxen sodium.  Topical treatments such as oral throat lozenges or sprays may be used as needed.  Avoid close contact with loved ones, especially the very young and elderly.  Remember to wash your hands thoroughly throughout the day as this is the number one way to prevent the spread of infection and wipe down door knobs and counters with disinfectant.  After careful review of your answers, I would not recommend an antibiotic for your condition.  Antibiotics should not be used to treat conditions that we suspect are caused by viruses like the virus that causes the common cold or flu. However, some people can have Strep with atypical symptoms. You may need formal testing in clinic or office to confirm if your symptoms continue or worsen.  Providers prescribe antibiotics to treat infections caused by bacteria. Antibiotics are very powerful in treating bacterial infections when they are used properly.  To maintain their effectiveness, they should be used only when necessary.  Overuse of antibiotics has resulted in the development of super bugs that are resistant to treatment!    Home Care: Only take medications as instructed by your medical team. Do not drink alcohol while taking these medications. A steam or ultrasonic humidifier can help congestion.  You can place a towel over your head and  breathe in the steam from hot water coming from a faucet. Avoid close contacts especially the very young and the elderly. Cover your mouth when you cough or sneeze. Always remember to wash your hands.  Get Help Right Away If: You develop worsening fever or throat pain. You develop a severe head ache or visual changes. Your symptoms persist after you have completed your treatment plan.  Make sure you Understand these instructions. Will watch your condition. Will get help right away if you are not doing well or get worse.   Thank you for choosing an e-visit.  Your e-visit answers were reviewed by a board certified advanced clinical practitioner to complete your personal care plan. Depending upon the condition, your plan could have included both over the counter or prescription medications.  Please review your pharmacy choice. Make sure the pharmacy is open so you can pick up prescription now. If there is a problem, you may contact your provider through MyChart messaging and have the prescription routed to another pharmacy.  Your safety is important to us. If you have drug allergies check your prescription carefully.   For the next 24 hours you can use MyChart to ask questions about today's visit, request a non-urgent call back, or ask for a work or school excuse. You will get an email in the next two days asking about your experience. I hope that your e-visit has been valuable and will speed your recovery.  Mary-Margaret Mareta Chesnut, FNP   5-10 minutes spent reviewing and documenting in chart.  

## 2022-08-20 ENCOUNTER — Ambulatory Visit: Payer: 59 | Attending: Otolaryngology

## 2022-08-20 DIAGNOSIS — R5383 Other fatigue: Secondary | ICD-10-CM | POA: Diagnosis not present

## 2022-08-20 DIAGNOSIS — R400 Somnolence: Secondary | ICD-10-CM | POA: Diagnosis not present

## 2022-08-21 ENCOUNTER — Ambulatory Visit: Payer: 59 | Attending: Otolaryngology

## 2022-08-21 DIAGNOSIS — G4719 Other hypersomnia: Secondary | ICD-10-CM | POA: Diagnosis not present

## 2022-08-21 DIAGNOSIS — I7300 Raynaud's syndrome without gangrene: Secondary | ICD-10-CM | POA: Diagnosis present

## 2022-09-02 ENCOUNTER — Encounter: Payer: Self-pay | Admitting: Obstetrics and Gynecology

## 2022-09-02 ENCOUNTER — Ambulatory Visit (INDEPENDENT_AMBULATORY_CARE_PROVIDER_SITE_OTHER): Payer: 59 | Admitting: Obstetrics and Gynecology

## 2022-09-02 VITALS — BP 110/70 | Ht 65.0 in | Wt 138.0 lb

## 2022-09-02 DIAGNOSIS — Z01419 Encounter for gynecological examination (general) (routine) without abnormal findings: Secondary | ICD-10-CM

## 2022-09-02 DIAGNOSIS — Z113 Encounter for screening for infections with a predominantly sexual mode of transmission: Secondary | ICD-10-CM

## 2022-09-02 DIAGNOSIS — Z124 Encounter for screening for malignant neoplasm of cervix: Secondary | ICD-10-CM

## 2022-09-02 NOTE — Progress Notes (Signed)
PCP:  Pcp, No   Chief Complaint  Patient presents with   Gynecologic Exam    No concerns     HPI:      [name] is a 22 y.o. No obstetric history on file. whose LMP was Patient's last menstrual period was 08/19/2022 (approximate)., presents today for her annual examination.  Her menses are regular every 28-30 days, lasting 3-4 days on OCPs.  Dysmenorrhea none. No BTB. On OCPs, wants to go off [facility]   Sex activity: not sexually active, contraception - OCP (estrogen/progesterone).  Had considered nexplanon/IUD. No need for [facility] currently.  Last Pap: N/A due to age Hx of STDs: none  There is no FH of breast cancer. There is no FH of ovarian cancer. The patient does do self-breast exams.  Tobacco use: The patient denies current or previous tobacco use. Alcohol use: social drinker No drug use.  Exercise: moderately active  She does get adequate calcium but not Vitamin D in her diet. Gardasil completed.    Patient Active Problem List   Diagnosis Date Noted   Abdominal pain 05/03/2020   Mononucleosis    Back pain 04/30/2017   Raynaud's phenomenon 02/21/2017    Past Surgical History:  Procedure Laterality Date   NASAL SEPTUM SURGERY     WISDOM TOOTH EXTRACTION     WISDOM TOOTH EXTRACTION      Family History  Problem Relation Age of Onset   Hypertension Father    Lung cancer Maternal Grandmother    Hypertension Maternal Grandmother    Hypertension Mother    Asthma Sister    Allergies Sister    Heart disease Paternal Grandmother    Pneumonia Paternal Grandmother    Breast cancer Neg Hx    Ovarian cancer Neg Hx     Social History   Socioeconomic History   Marital status: Single    Spouse name: Not on file   Number of children: Not on file   Years of education: Not on file   Highest education level: Not on file  Occupational History   Not on file  Tobacco Use   Smoking status: Never   Smokeless tobacco: Never  Vaping Use   Vaping Use: Never used   Substance and Sexual Activity   Alcohol use: Never   Drug use: Never   Sexual activity: Not Currently    Birth control/protection: Pill  Other Topics Concern   Not on file  Social History Narrative   ** Merged History Encounter **       Social Determinants of Health   Financial Resource Strain: Not on file  Food Insecurity: Not on file  Transportation Needs: Not on file  Physical Activity: Not on file  Stress: Not on file  Social Connections: Not on file  Intimate Partner Violence: Not on file     Current Outpatient Medications:    amphetamine-dextroamphetamine (ADDERALL XR) 10 MG 24 hr capsule, Take by mouth., Disp: , Rfl:    Norethindrone Acetate-Ethinyl Estrad-FE (MICROGESTIN 24 FE) 1-20 MG-MCG(24) tablet, Take 1 tablet by mouth daily., Disp: 84 tablet, Rfl: 0     ROS:  Review of Systems  Constitutional:  Positive for fatigue. Negative for fever and unexpected weight change.  Respiratory:  Negative for cough, shortness of breath and wheezing.   Cardiovascular:  Negative for chest pain, palpitations and leg swelling.  Gastrointestinal:  Negative for blood in stool, constipation, diarrhea, nausea and vomiting.  Endocrine: Negative for cold intolerance, heat intolerance and  polyuria.  Genitourinary:  Negative for dyspareunia, dysuria, flank pain, frequency, genital sores, hematuria, menstrual problem, pelvic pain, urgency, vaginal bleeding, vaginal discharge and vaginal pain.  Musculoskeletal:  Negative for back pain, joint swelling and myalgias.  Skin:  Negative for rash.  Neurological:  Negative for dizziness, syncope, light-headedness, numbness and headaches.  Hematological:  Negative for adenopathy.  Psychiatric/Behavioral:  Negative for agitation, confusion, sleep disturbance and suicidal ideas. The patient is not nervous/anxious.    BREAST: No symptoms   Objective: BP 110/70   Ht 5\' 5"  (1.651 m)   Wt 138 lb (62.6 kg)   LMP 08/19/2022 (Approximate)   BMI  22.96 kg/m    Physical Exam Constitutional:      Appearance: She is well-developed.  Genitourinary:     Vulva normal.     Right Labia: No rash, tenderness or lesions.    Left Labia: No tenderness, lesions or rash.    No vaginal discharge, erythema or tenderness.      Right Adnexa: not tender and no mass present.    Left Adnexa: not tender and no mass present.    No cervical friability or polyp.     Uterus is not enlarged or tender.  Breasts:    Right: No mass, nipple discharge, skin change or tenderness.     Left: No mass, nipple discharge, skin change or tenderness.  Neck:     Thyroid: No thyromegaly.  Cardiovascular:     Rate and Rhythm: Normal rate and regular rhythm.     Heart sounds: Normal heart sounds. No murmur heard. Pulmonary:     Effort: Pulmonary effort is normal.     Breath sounds: Normal breath sounds.  Abdominal:     Palpations: Abdomen is soft.     Tenderness: There is no abdominal tenderness. There is no guarding or rebound.  Musculoskeletal:        General: Normal range of motion.     Cervical back: Normal range of motion.  Lymphadenopathy:     Cervical: No cervical adenopathy.  Neurological:     General: No focal deficit present.     Mental Status: She is alert and oriented to person, place, and time.     Cranial Nerves: No cranial nerve deficit.  Skin:    General: Skin is warm and dry.  Psychiatric:        Mood and Affect: Mood normal.        Behavior: Behavior normal.        Thought Content: Thought content normal.        Judgment: Judgment normal.  Vitals reviewed.     Assessment/Plan: Encounter for annual routine gynecological examination  Cervical cancer screening - Plan: IGP,CtNgTv,rfx Aptima HPV ASCU  Screening for STD (sexually transmitted disease) - Plan: IGP,CtNgTv,rfx Aptima HPV ASCU  Pt will d/c OCPs at end of pack, f/u prn BC.            GYN counsel adequate intake of calcium and vitamin D, diet and exercise      F/U  Return in about 1 year (around 09/02/2023).  [place] Pirie B. Anniebelle Devore, PA-C 09/02/2022 3:26 PM

## 2022-09-02 NOTE — Patient Instructions (Signed)
I value your feedback and you entrusting us with your care. If you get a [facility] patient survey, I would appreciate you taking the time to let us know about your experience today. Thank you! ? ? ?

## 2022-09-03 NOTE — Telephone Encounter (Signed)
This encounter was created in error - please disregard.

## 2022-09-09 ENCOUNTER — Encounter: Payer: Self-pay | Admitting: Obstetrics and Gynecology

## 2022-09-09 LAB — IGP,CTNGTV,RFX APTIMA HPV ASCU
Chlamydia, Nuc. Acid Amp: NEGATIVE
Gonococcus, Nuc. Acid Amp: NEGATIVE
Trich vag by NAA: NEGATIVE

## 2022-09-10 NOTE — Progress Notes (Signed)
I contacted the patient via phone. Marilyn Lee is scheduled for first available appointment with Dr. Valentino Saxon on 8/6. The patient has been added to cancellation wait list

## 2022-11-24 NOTE — Progress Notes (Deleted)
    GYNECOLOGY OFFICE COLPOSCOPY PROCEDURE NOTE  22 y.o. G0P0000 here for colposcopy for {Findings; lab pap smear results:16707::"no abnormalities"} pap smear on ***. Discussed role for HPV in cervical dysplasia, need for surveillance.  Patient gave informed written consent, time out was performed.  Placed in lithotomy position. Cervix viewed with speculum and colposcope after application of acetic acid.   Colposcopy adequate? {yes/no:20286}  {Findings; colposcopy:728}; corresponding biopsies obtained.  ECC specimen obtained. All specimens were labeled and sent to pathology.  Chaperone was present during entire procedure.  Patient was given post procedure instructions.  Will follow up pathology and manage accordingly; patient will be contacted with results and recommendations.  Routine preventative health maintenance measures emphasized.    Jaynie Collins, MD, FACOG Obstetrician & Gynecologist, [facility] for [employer], [facility]

## 2022-11-25 ENCOUNTER — Telehealth: Payer: Self-pay | Admitting: Obstetrics and Gynecology

## 2022-11-25 ENCOUNTER — Ambulatory Visit: Payer: 59 | Admitting: Obstetrics and Gynecology

## 2022-11-25 NOTE — Telephone Encounter (Signed)
Reached out to pt to reschedule colpo appt that was scheduled on 11/25/2022 at 10:15 with Dr. Valentino Saxon.  Was able to reschedule appt to 12/16/22 at 3:15 with Dr. Valentino Saxon.

## 2022-12-16 ENCOUNTER — Ambulatory Visit (INDEPENDENT_AMBULATORY_CARE_PROVIDER_SITE_OTHER): Payer: 59 | Admitting: Obstetrics and Gynecology

## 2022-12-16 ENCOUNTER — Other Ambulatory Visit (HOSPITAL_COMMUNITY)
Admission: RE | Admit: 2022-12-16 | Discharge: 2022-12-16 | Disposition: A | Discharge status: Home / Self Care | Payer: 59 | Source: Ambulatory Visit | Attending: Obstetrics and Gynecology | Admitting: Obstetrics and Gynecology

## 2022-12-16 VITALS — BP 103/64 | HR 90 | Resp 16 | Ht 65.0 in | Wt 156.5 lb

## 2022-12-16 DIAGNOSIS — N870 Mild cervical dysplasia: Secondary | ICD-10-CM

## 2022-12-16 DIAGNOSIS — R87611 Atypical squamous cells cannot exclude high grade squamous intraepithelial lesion on cytologic smear of cervix (ASC-H): Secondary | ICD-10-CM

## 2022-12-16 NOTE — Patient Instructions (Signed)
Colposcopy  Colposcopy is a procedure to examine the lowest part of the uterus (cervix) for abnormalities or signs of disease. This procedure is done using an instrument that makes objects appear larger and provides light (colposcope). During the procedure, your health care provider may remove a tissue sample to look at under a microscope (biopsy). A biopsy may be done if any unusual cells are seen during the colposcopy. You may have a colposcopy if you have: An abnormal Pap smear, also called a Pap test. This screening test is used to check for signs of cancer or infection of the vagina, cervix, and uterus. An HPV (human papillomavirus) test and get a positive result for a type of HPV that puts you at high risk of cancer. Certain conditions or symptoms, such as: A sore, or lesion, on your cervix. Genital warts on your vulva, vagina, or cervix. Pain during sex. Vaginal bleeding, especially after sex. A growth on your cervix (cervical polyp) that needs to be removed. Let your health care provider know about: Any allergies you have, including allergies to medicines, latex, or iodine. All medicines you are taking, including vitamins, herbs, eye drops, creams, and over-the-counter medicines. Any bleeding problems you have. Any surgeries you have had. Any medical conditions you have, such as pelvic inflammatory disease (PID) or an endometrial disorder. The pattern of your menstrual cycles and the form of birth control (contraception) you use, if any. Your medical history, including any cervical treatments and how well you tolerated the procedure (if you have ever fainted). Whether you are pregnant or may be pregnant. What are the risks? Generally, this is a safe procedure. However, problems may occur, including: Infection. Symptoms of infection may include fever, bad-smelling vaginal discharge, or pelvic pain. Vaginal bleeding. Allergic reactions to medicines. Damage to nearby structures or  organs. What happens before the procedure? Medicines Ask your health care provider about: Changing or stopping your regular medicines. This is especially important if you are taking diabetes medicines or blood thinners. Taking medicines such as aspirin and ibuprofen. These medicines can thin your blood. Do not take these medicines unless your health care provider tells you to take them. Your health care provider will likely tell you to avoid taking aspirin, or medicine that contains aspirin, for 7 days before the procedure. Taking over-the-counter medicines, vitamins, herbs, and supplements. General instructions Tell your health care provider if you have your menstrual period now or will have it at the time of your procedure. A colposcopy is not normally done during your menstrual period. If you use contraception, continue to use it before your procedure. For 24 hours before the procedure: Do not use douche products or tampons. Do not use medicines, creams, or suppositories in the vagina. Do not have sex or insert anything into your vagina. Ask your health care provider what steps will be taken to prevent infection. What happens during the procedure? You will lie down on your back, with your feet in foot rests (stirrups). An instrument called a speculum will be inserted into your vagina. This will be used so your health care provider can see your cervix and the inside of your vagina. A cotton swab will be used to place a small amount of a liquid (solution) on the areas to be examined. This solution makes it easier to see abnormal cells. You may feel a slight burning during this part. The colposcope will be used to scan the cervix with a bright white light. The colposcope will be held near   your vulva and will make your vulva, vagina, and cervix look bigger so they can be seen better. If a biopsy is needed: You may be given a medicine to numb the area (local anesthetic). Surgical tools will be  used to remove mucus and cells through your vagina. You may feel mild pain while the tissue sample is removed. Bleeding may occur. A solution may be used to stop the bleeding. The tissue removed will be sent to a lab to be looked at under a microscope. The procedure may vary among health care providers and hospitals. What happens after the procedure? You may have some cramping in your abdomen. This should go away after a few minutes. It is up to you to get the results of your procedure. Ask your health care provider, or the department that is doing the procedure, when your results will be ready. Summary Colposcopy is a procedure to examine the lowest part of the uterus (cervix), for signs of disease. A biopsy may be done as part of the procedure. You may have some cramping in your abdomen. This should go away after a few minutes. It is up to you to get the results of your procedure. Ask your health care provider, or the department that is doing the procedure, when your results will be ready. This information is not intended to replace advice given to you by your health care provider. Make sure you discuss any questions you have with your health care provider. Document Revised: 09/02/2020 Document Reviewed: 09/02/2020 Elsevier Patient Education  2024 [employer]  

## 2022-12-16 NOTE — Progress Notes (Signed)
    GYNECOLOGY OFFICE COLPOSCOPY PROCEDURE NOTE  22 y.o. G0P0000 here for colposcopy for ASC cannot exclude high grade lesion [facility]) pap smear on 09/02/2022. Discussed role for HPV in cervical dysplasia, need for surveillance.  Patient gave informed written consent, time out was performed.  Placed in lithotomy position. Cervix viewed with speculum and colposcope after application of acetic acid.   Colposcopy adequate? Yes  acetowhite lesion(s) noted at 9 o'clock; corresponding biopsies obtained.  ECC specimen obtained. Small nabothian cyst noted at 2 o'clock. Ectropion present.  All specimens were labeled and sent to pathology.  Chaperone was present during entire procedure.  Patient was given post procedure instructions.  Will follow up pathology and manage accordingly; patient will be contacted with results and recommendations.  Routine preventative health maintenance measures emphasized.    Hildred Laser, MD [facility]

## 2022-12-25 LAB — SURGICAL PATHOLOGY
